# Patient Record
Sex: Female | Born: 1970
Health system: Southern US, Community
[De-identification: ages and names within clinical notes are randomized; demographics above are authoritative.]

## PROBLEM LIST (undated history)

## (undated) ENCOUNTER — Non-Acute Institutional Stay: Payer: PRIVATE HEALTH INSURANCE

## (undated) DIAGNOSIS — E7800 Pure hypercholesterolemia, unspecified: Secondary | ICD-10-CM

## (undated) DIAGNOSIS — E079 Disorder of thyroid, unspecified: Secondary | ICD-10-CM

## (undated) DIAGNOSIS — I10 Essential (primary) hypertension: Secondary | ICD-10-CM

## (undated) DIAGNOSIS — J302 Other seasonal allergic rhinitis: Secondary | ICD-10-CM

## (undated) HISTORY — DX: Essential (primary) hypertension: I10

## (undated) HISTORY — PX: BREAST SURGERY: SHX581

## (undated) HISTORY — DX: Other seasonal allergic rhinitis: J30.2

## (undated) HISTORY — PX: GYNECOLOGIC CRYOSURGERY: SHX857

## (undated) HISTORY — DX: Disorder of thyroid, unspecified: E07.9

## (undated) HISTORY — DX: Pure hypercholesterolemia, unspecified: E78.00

---

## 1997-10-30 ENCOUNTER — Inpatient Hospital Stay (HOSPITAL_COMMUNITY): Admission: AD | Admit: 1997-10-30 | Discharge: 1997-10-31 | Payer: Self-pay | Admitting: Gynecology

## 1998-12-31 ENCOUNTER — Other Ambulatory Visit: Admission: RE | Admit: 1998-12-31 | Discharge: 1998-12-31 | Payer: Self-pay | Admitting: Gynecology

## 2001-05-18 ENCOUNTER — Other Ambulatory Visit: Admission: RE | Admit: 2001-05-18 | Discharge: 2001-05-18 | Payer: Self-pay | Admitting: Gynecology

## 2002-11-01 ENCOUNTER — Other Ambulatory Visit: Admission: RE | Admit: 2002-11-01 | Discharge: 2002-11-01 | Payer: Self-pay | Admitting: Gynecology

## 2004-04-03 ENCOUNTER — Other Ambulatory Visit: Admission: RE | Admit: 2004-04-03 | Discharge: 2004-04-03 | Payer: Self-pay | Admitting: Gynecology

## 2004-10-01 ENCOUNTER — Other Ambulatory Visit: Admission: RE | Admit: 2004-10-01 | Discharge: 2004-10-01 | Payer: Self-pay | Admitting: Gynecology

## 2005-04-10 ENCOUNTER — Other Ambulatory Visit: Admission: RE | Admit: 2005-04-10 | Discharge: 2005-04-10 | Payer: Self-pay | Admitting: Gynecology

## 2006-10-07 ENCOUNTER — Other Ambulatory Visit: Admission: RE | Admit: 2006-10-07 | Discharge: 2006-10-07 | Payer: Self-pay | Admitting: Gynecology

## 2006-10-11 ENCOUNTER — Encounter: Admission: RE | Admit: 2006-10-11 | Discharge: 2006-10-11 | Payer: Self-pay | Admitting: Gynecology

## 2007-10-13 ENCOUNTER — Other Ambulatory Visit: Admission: RE | Admit: 2007-10-13 | Discharge: 2007-10-13 | Payer: Self-pay | Admitting: Gynecology

## 2008-12-27 ENCOUNTER — Ambulatory Visit: Payer: Self-pay | Admitting: Women's Health

## 2008-12-27 ENCOUNTER — Other Ambulatory Visit: Admission: RE | Admit: 2008-12-27 | Discharge: 2008-12-27 | Payer: Self-pay | Admitting: Gynecology

## 2008-12-27 ENCOUNTER — Encounter: Payer: Self-pay | Admitting: Women's Health

## 2009-12-02 ENCOUNTER — Other Ambulatory Visit: Admission: RE | Admit: 2009-12-02 | Discharge: 2009-12-02 | Payer: Self-pay | Admitting: Gynecology

## 2009-12-02 ENCOUNTER — Ambulatory Visit: Payer: Self-pay | Admitting: Women's Health

## 2010-12-16 HISTORY — PX: REFRACTIVE SURGERY: SHX103

## 2011-09-16 ENCOUNTER — Ambulatory Visit (INDEPENDENT_AMBULATORY_CARE_PROVIDER_SITE_OTHER): Payer: BC Managed Care – PPO | Admitting: Women's Health

## 2011-09-16 ENCOUNTER — Encounter: Payer: Self-pay | Admitting: Women's Health

## 2011-09-16 ENCOUNTER — Other Ambulatory Visit: Payer: Self-pay | Admitting: Women's Health

## 2011-09-16 VITALS — BP 142/100 | Ht 65.0 in | Wt 156.0 lb

## 2011-09-16 DIAGNOSIS — N920 Excessive and frequent menstruation with regular cycle: Secondary | ICD-10-CM

## 2011-09-16 DIAGNOSIS — Z01419 Encounter for gynecological examination (general) (routine) without abnormal findings: Secondary | ICD-10-CM

## 2011-09-16 DIAGNOSIS — Z833 Family history of diabetes mellitus: Secondary | ICD-10-CM

## 2011-09-16 LAB — CBC WITH DIFFERENTIAL/PLATELET
Lymphocytes Relative: 35 % (ref 12–46)
MCV: 93.2 fL (ref 78.0–100.0)
Monocytes Absolute: 0.5 10*3/uL (ref 0.1–1.0)
Neutro Abs: 4.3 10*3/uL (ref 1.7–7.7)
Neutrophils Relative %: 57 % (ref 43–77)
Platelets: 278 10*3/uL (ref 150–400)
WBC: 7.6 10*3/uL (ref 4.0–10.5)

## 2011-09-16 LAB — URINALYSIS, ROUTINE W REFLEX MICROSCOPIC
Bilirubin Urine: NEGATIVE
Hgb urine dipstick: NEGATIVE
Leukocytes, UA: NEGATIVE
Protein, ur: NEGATIVE mg/dL
Specific Gravity, Urine: 1.02 (ref 1.005–1.030)

## 2011-09-16 NOTE — Patient Instructions (Signed)
Call office first day of next cycle to schedule [facility]  With Dr Audie Box

## 2011-09-16 NOTE — Progress Notes (Signed)
Bethany Mata 1971/03/27 [id number]    History:    The patient presents for annual exam.  Cycles have been every month for 5 days and in the last 6-9 months states periods are less than 21 days, several cycles extremely heavy. Denies any bleeding between cycles. History of normal Paps since 89, cryo-in 1990. Husband had vasectomy.   Past medical history, past surgical history, family history and social history were all reviewed and documented in the EPIC chart. History of elevated blood pressures in pregnancy. 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   ROS:  A  ROS was performed and pertinent positives and negatives are included in the history.  Exam:  Filed Vitals:   09/16/11 1444  BP: 142/100    General appearance:  Normal Head/Neck:  Normal, without cervical or supraclavicular adenopathy. Thyroid:  Symmetrical, normal in size, without palpable masses or nodularity. Respiratory  Effort:  Normal  Auscultation:  Clear without wheezing or rhonchi Cardiovascular  Auscultation:  Regular rate, without rubs, murmurs or gallops  Edema/varicosities:  Not grossly evident Abdominal  Soft,nontender, without masses, guarding or rebound.  Liver/spleen:  No organomegaly noted  Hernia:  None appreciated  Skin  Inspection:  Grossly normal  Palpation:  Grossly normal Neurologic/psychiatric  Orientation:  Normal with appropriate conversation.  Mood/affect:  Normal  Genitourinary    Breasts: Examined lying and sitting.     Right: Without masses, retractions, discharge or axillary adenopathy.     Left: Without masses, retractions, discharge or axillary adenopathy.   Inguinal/mons:  Normal without inguinal adenopathy  External genitalia:  Normal  BUS/Urethra/Skene's glands:  Normal  Bladder:  Normal  Vagina:  Normal  Cervix:  Normal  Uterus:   normal in size, shape and contour.  Midline and mobile  Adnexa/parametria:      Rt: Without masses or tenderness.   Lt: Without masses or tenderness.  Anus and perineum: Normal  Digital rectal exam: Normal sphincter tone without palpated masses or tenderness  Assessment/Plan:  41 y.o. M. WF G2 P2 for annual exam.    Menorrhagia with increased irregularity in last year BP 142/100-took Sudafed today.  Plan: Options for menorrhagia reviewed. Will schedule sonohysterogram with Dr. Audie Box, HER option discussed, handout given. SBE's, exercise, annual mammogram, calcium rich diet encouraged. Reviewed importance of followup for blood pressure, reviewed if greater than 130/80 to followup with primary care for possible medication. Instructed to avoid Sudafed. CBC, Glucose, TSH, prolactin, UA.    Harrington Challenger WHNP, 5:06 PM 09/16/2011

## 2011-09-17 LAB — THYROID PROFILE - CHCC
Free Thyroxine Index: 2.2 (ref 1.0–3.9)
T4, Total: 9.1 ug/dL (ref 5.0–12.5)

## 2011-10-05 ENCOUNTER — Other Ambulatory Visit: Payer: Self-pay | Admitting: *Deleted

## 2011-10-05 DIAGNOSIS — E039 Hypothyroidism, unspecified: Secondary | ICD-10-CM

## 2011-10-05 MED ORDER — SYNTHROID 50 MCG PO TABS: 50.0000 ug | ORAL_TABLET | Freq: Every day | ORAL | Status: DC

## 2011-10-07 ENCOUNTER — Ambulatory Visit (INDEPENDENT_AMBULATORY_CARE_PROVIDER_SITE_OTHER): Payer: BC Managed Care – PPO

## 2011-10-07 ENCOUNTER — Other Ambulatory Visit: Payer: BC Managed Care – PPO

## 2011-10-07 ENCOUNTER — Other Ambulatory Visit: Payer: Self-pay | Admitting: Gynecology

## 2011-10-07 ENCOUNTER — Ambulatory Visit (INDEPENDENT_AMBULATORY_CARE_PROVIDER_SITE_OTHER): Payer: BC Managed Care – PPO | Admitting: Gynecology

## 2011-10-07 ENCOUNTER — Ambulatory Visit: Payer: BC Managed Care – PPO | Admitting: Gynecology

## 2011-10-07 ENCOUNTER — Encounter: Payer: Self-pay | Admitting: Gynecology

## 2011-10-07 DIAGNOSIS — N854 Malposition of uterus: Secondary | ICD-10-CM

## 2011-10-07 DIAGNOSIS — E039 Hypothyroidism, unspecified: Secondary | ICD-10-CM

## 2011-10-07 DIAGNOSIS — D259 Leiomyoma of uterus, unspecified: Secondary | ICD-10-CM

## 2011-10-07 DIAGNOSIS — D251 Intramural leiomyoma of uterus: Secondary | ICD-10-CM

## 2011-10-07 DIAGNOSIS — N8300 Follicular cyst of ovary, unspecified side: Secondary | ICD-10-CM

## 2011-10-07 DIAGNOSIS — N920 Excessive and frequent menstruation with regular cycle: Secondary | ICD-10-CM

## 2011-10-07 NOTE — Patient Instructions (Signed)
Office will contact you to schedule the ablation. We will call you with your thyroid results

## 2011-10-07 NOTE — Progress Notes (Signed)
Patient presents for sonohysterogram due to history of progressively worsening periods that are now every 21 days, bleeding heavily, double protection, bleedthrough episodes. She had a thyroid panel that showed a mildly elevated TSH but normal T4 and she Willrepeat her TSH today.   Sonohysterogram shows endometrial echo 10.6 mm, retroverted uterus with 2 small myomas at 24 and 13 mm. Initially there was an echogenic area within the endometrium at 7 x 9 mm. Her right/left ovaries are normal with physiologic changes. Sonohysterogram was performed, sterile technique, easy catheter introduction, good distention, no abnormality seen. The echogenic areas was within the endometrium. An endometrial biopsy was taken. The patient tolerated well.  Assessment and plan: Menorrhagia, mildly elevated TSH. She'll repeat that today. With the normal T4 I doubt this is continuing to her menorrhagia I think this is more hormone related. Using vasectomy birth control. I reviewed options with the patient to include hormonal manipulation, Mirena IUD, endometrial ablation, hysterectomy. The patient already has thought of her options wants to proceed with HerOption endometrial ablation. I reviewed what was involved with the procedure, risks/benefits. She understands it is permanent, that she should never pursue pregnancy following this and should other situations present themselves she needs to use assured birth control. She also understands that she will probably continue to have menses afterwards but hopefully later and that amenorrhea is not guaranteed. Lastly she understands that there are failures and that her bleeding may persistently be heavier or worsen and that we will have to use an alternative treatment in the future. We'll go ahead and schedule her and she will represent for a preoperative appointment. She will follow up for biopsy results and TSH results. Possible need for thyroid replacement was reviewed with her and the  risks/benefits of this discussed.

## 2011-10-08 ENCOUNTER — Telehealth: Payer: Self-pay

## 2011-10-08 ENCOUNTER — Other Ambulatory Visit: Payer: Self-pay | Admitting: Women's Health

## 2011-10-08 DIAGNOSIS — E039 Hypothyroidism, unspecified: Secondary | ICD-10-CM

## 2011-10-08 LAB — TSH: TSH: 5.957 u[IU]/mL — ABNORMAL HIGH (ref 0.350–4.500)

## 2011-10-08 NOTE — Telephone Encounter (Signed)
Dr. Velvet Bathe sent me order to schedule Bethany Mata Option Ablation for patient. I called Bethany Mata to let Bethany Mata know I checked ins benefits and it is a covered procedure based on medical necessity and applies to Bethany Mata ded and co-insurance.  Bethany Mata prepayment amount is $1318.00 and she was informed. She has HSA card she plans to use to pay for it.  She said Bethany Mata cycles are so irregular she cannot predict when Bethany Mata next period will be.  She will call me Day One of Bethany Mata menses. I explained importance of calling me asap after starts menses so I can call Prometrium in for Bethany Mata to start Day 3.  If weekend start she will call me first thing on Monday morning.  I will schedule Bethany Mata then and e-scribe Bethany Mata Prometrium.

## 2011-10-16 HISTORY — PX: ENDOMETRIAL ABLATION: SHX621

## 2011-10-23 ENCOUNTER — Other Ambulatory Visit: Payer: Self-pay

## 2011-10-23 ENCOUNTER — Telehealth: Payer: Self-pay

## 2011-10-23 DIAGNOSIS — N920 Excessive and frequent menstruation with regular cycle: Secondary | ICD-10-CM

## 2011-10-23 MED ORDER — MISOPROSTOL 200 MCG PO TABS: ORAL_TABLET | ORAL | Status: AC

## 2011-10-23 MED ORDER — PROGESTERONE MICRONIZED 200 MG PO CAPS: 200.0000 mg | ORAL_CAPSULE | Freq: Every day | ORAL | Status: DC

## 2011-10-23 NOTE — Telephone Encounter (Signed)
Pt. Called to tell me her menses began yesterday and she wants to schedule Her Option ablation.  I escribed her Prometrium and instructed her to start it tomorrow night hs.  Also, e-scribed her Cytotec and instructed her regarding that.  She will see Dr. Velvet Bathe for preop consult on 3/22 and procedure will be Day 20 of her cycle on March 27th 9:30am/10:00am.  She was instructed that she will need full bladder am of procedure and also someone to drive her to and from procedure.  I have mailed her all the dates as well as full bladder instructions.  She will call me if any questions.

## 2011-11-06 ENCOUNTER — Encounter: Payer: Self-pay | Admitting: Gynecology

## 2011-11-06 ENCOUNTER — Ambulatory Visit (INDEPENDENT_AMBULATORY_CARE_PROVIDER_SITE_OTHER): Payer: BC Managed Care – PPO | Admitting: Gynecology

## 2011-11-06 DIAGNOSIS — E039 Hypothyroidism, unspecified: Secondary | ICD-10-CM

## 2011-11-06 DIAGNOSIS — N920 Excessive and frequent menstruation with regular cycle: Secondary | ICD-10-CM

## 2011-11-06 MED ORDER — AZITHROMYCIN 500 MG PO TABS: 500.0000 mg | ORAL_TABLET | Freq: Every day | ORAL | Status: AC

## 2011-11-06 MED ORDER — HYDROCODONE-ACETAMINOPHEN 5-500 MG PO TABS: 1.0000 | ORAL_TABLET | Freq: Four times a day (QID) | ORAL | Status: AC | PRN

## 2011-11-06 MED ORDER — DIAZEPAM 5 MG PO TABS: 5.0000 mg | ORAL_TABLET | Freq: Four times a day (QID) | ORAL | Status: AC | PRN

## 2011-11-06 MED ORDER — SYNTHROID 50 MCG PO TABS: 50.0000 ug | ORAL_TABLET | Freq: Every day | ORAL | Status: DC

## 2011-11-06 NOTE — Patient Instructions (Signed)
Call if you have any questions. Follow up for your HerOption endometrial ablation

## 2011-11-06 NOTE — Progress Notes (Signed)
Patient presents preop for her upcoming HerOption endometrial ablation. Recently was started on Synthroid 50 mcg 3 weeks ago due to her mildly elevated TSH. I reviewed the procedure with her to include the expected intraoperative postoperative courses. The acute risks of bleeding, infection, uterine perforation requiring major exploratory reparative surgeries was reviewed. She understands there are no guarantees as far as menorrhagia relief and that her periods may continue heavier return heavy requiring future treatments up to including hysterectomy. She also understands that she more than likely will continue to have monthly menses but hopefully that they will be lighter.  She understands she should never get pregnant following this procedure. She is using vasectomy but understands that if other opportunities present that she needs to use assured birth control.  I also reviewed the long-term risks to include hematometria. She is ready for our consent form and signed the consent form. I reviewed her medication list with her and how to take the medications and answered all of her questions.  Exam with Sherrilyn Rist chaperone present Pelvic external BUS vagina normal. Cervix normal. Uterus normal size midline mobile nontender. Adnexa without masses or tenderness  Assessment and plan: Menorrhagia unacceptable to the patient, negative sonohysterogram and biopsy, mildly elevated TSH currently on Synthroid replacement for HerOption endometrial ablation. Her questions were answered she is ready to proceed with the procedure.  Patient knows to repeat her TSH when she comes in for her postop visit after the ablation.

## 2011-11-11 ENCOUNTER — Ambulatory Visit (INDEPENDENT_AMBULATORY_CARE_PROVIDER_SITE_OTHER): Payer: BC Managed Care – PPO

## 2011-11-11 ENCOUNTER — Encounter: Payer: Self-pay | Admitting: Gynecology

## 2011-11-11 ENCOUNTER — Ambulatory Visit (INDEPENDENT_AMBULATORY_CARE_PROVIDER_SITE_OTHER): Payer: BC Managed Care – PPO | Admitting: Gynecology

## 2011-11-11 VITALS — BP 140/86 | HR 84

## 2011-11-11 VITALS — BP 140/100 | HR 80

## 2011-11-11 DIAGNOSIS — N949 Unspecified condition associated with female genital organs and menstrual cycle: Secondary | ICD-10-CM

## 2011-11-11 DIAGNOSIS — N920 Excessive and frequent menstruation with regular cycle: Secondary | ICD-10-CM

## 2011-11-11 MED ORDER — KETOROLAC TROMETHAMINE 30 MG/ML IJ SOLN
60.0000 mg | Freq: Once | INTRAMUSCULAR | Status: AC
  Administered 2011-11-11: 60 mg via INTRAMUSCULAR

## 2011-11-11 MED ORDER — LIDOCAINE HCL 1 % IJ SOLN: 10.0000 mL | Freq: Once | INTRAMUSCULAR | Status: DC

## 2011-11-11 NOTE — Progress Notes (Signed)
HER OPTION ENDOMETRIAL ABLATION PROCEDURAL NOTE    TORINA EY 1970/10/23 [id number]   11/11/2011  Diagnosis:  Excessive uterine bleeding / Menorrhagia  Procedure:  Endometrial cryoablation with intraoperative ultrasonic guidance  Procedure:  The patient was brought to the treatment room having previously been counseled for the procedure and having signed the consent form that is scanned into Epic. Pre procedural medications received were Toradol 30 mg IM.  The patient was placed in the dorsal lithotomy position and a speculum was inserted. The cervix and upper vagina were cleansed with Betadine. A single-tooth tenaculum was placed on the anterior lip of the cervix. A  paracervical block was placed yes using 10 cc's of 1% lidocaine.  The uterus was yes sounded 9 centimeters. Cervical dilatation performed no . Under ultrasound guidance, the Her Option probe was introduced into the uterine cavity after the preprocedural sequence was performed. After assuring proper cornual placement, cryoablation was then performed under continuous ultrasound guidance monitoring the growth of the cryo-zone. Sequential cryoablation's were performed in the following order:   Location   Length of Time Myometrial Depth 1. Right cornua   6 minutes  Sagittal 11.5 mm Transverse 10.4 mm 2. Left cornua   5 minutes  Sagittal 14.4 mm Transverse 14.4 mm  Upon completion of the procedure the instruments were removed, hemostasis visualized the patient was assisted to the bathroom and then to another exam room where she was observed. The patient tolerated the procedure well and was released in stable condition with her driver along with a copy of the postprocedural instructions and precautions which were reviewed with her. She is to return to the office 10 April for post procedural check and received an appointment date and time.    Dara Lords MD, 11:13 AM 11/11/2011

## 2011-11-11 NOTE — Patient Instructions (Signed)
Endometrial Ablation Endometrial ablation removes the lining of the uterus (endometrium). It is usually a same day, outpatient treatment. Ablation helps avoid major surgery (such as a hysterectomy). A hysterectomy is removal of the cervix and uterus. Endometrial ablation has less risk and complications, has a shorter recovery period and is less expensive. After endometrial ablation, most women will have little or no menstrual bleeding. You may not keep your fertility. Pregnancy is no longer likely after this procedure but if you are pre-menopausal, you still need to use a reliable method of birth control following the procedure because pregnancy can occur. REASONS TO HAVE THE PROCEDURE MAY INCLUDE:  Heavy periods.   Bleeding that is causing anemia.   Anovulatory bleeding, very irregular, bleeding.   Bleeding submucous fibroids (on the lining inside the uterus) if they are smaller than 3 centimeters.  REASONS NOT TO HAVE THE PROCEDURE MAY INCLUDE:  You wish to have more children.   You have a pre-cancerous or cancerous problem. The cause of any abnormal bleeding must be diagnosed before having the procedure.   You have pain coming from the uterus.   You have a submucus fibroid larger than 3 centimeters.   You recently had a baby.   You recently had an infection in the uterus.   You have a severe retro-flexed, tipped uterus and cannot insert the instrument to do the ablation.   You had a Cesarean section or deep major surgery on the uterus.   The inner cavity of the uterus is too large for the endometrial ablation instrument.  RISKS AND COMPLICATIONS   Perforation of the uterus.   Bleeding.   Infection of the uterus, bladder or vagina.   Injury to surrounding organs.   Cutting the cervix.   An air bubble to the lung (air embolus).   Pregnancy following the procedure.   Failure of the procedure to help the problem requiring hysterectomy.   Decreased ability to diagnose  cancer in the lining of the uterus.  BEFORE THE PROCEDURE  The lining of the uterus must be tested to make sure there is no pre-cancerous or cancer cells present.   Medications may be given to make the lining of the uterus thinner.   Ultrasound may be used to evaluate the size and look for abnormalities of the uterus.   Future pregnancy is not desired.  PROCEDURE  There are different ways to destroy the lining of the uterus.   Resectoscope - radio frequency-alternating electric current is the most common one used.   Cryotherapy - freezing the lining of the uterus.   Heated Free Liquid - heated salt (saline) solution inserted into the uterus.   Microwave - uses high energy microwaves in the uterus.   Thermal Balloon - a catheter with a balloon tip is inserted into the uterus and filled with heated fluid.  Your caregiver will talk with you about the method used in this clinic. They will also instruct you on the pros and cons of the procedure. Endometrial ablation is performed along with a procedure called operative hysteroscopy. A narrow viewing tube is inserted through the birth canal (vagina) and through the cervix into the uterus. A tiny camera attached to the viewing tube (hysteroscope) allows the uterine cavity to be shown on a TV monitor during surgery. Your uterus is filled with a harmless liquid to make the procedure easier. The lining of the uterus is then removed. The lining can also be removed with a resectoscope which allows your surgeon   to cut away the lining of the uterus under direct vision. Usually, you will be able to go home within an hour after the procedure. HOME CARE INSTRUCTIONS   Do not drive for 24 hours.   No tampons, douching or intercourse for 2 weeks or until your caregiver approves.   Rest at home for 24 to 48 hours. You may then resume normal activities unless told differently by your caregiver.   Take your temperature two times a day for 4 days, and record  it.   Take any medications your caregiver has ordered, as directed.   Use some form of contraception if you are pre-menopausal and do not want to get pregnant.  Bleeding after the procedure is normal. It varies from light spotting and mildly watery to bloody discharge for 4 to 6 weeks. You may also have mild cramping. Only take over-the-counter or prescription medicines for pain, discomfort, or fever as directed by your caregiver. Do not use aspirin, as this may aggravate bleeding. Frequent urination during the first 24 hours is normal. You will not know how effective your surgery is until at least 3 months after the surgery. SEEK IMMEDIATE MEDICAL CARE IF:   Bleeding is heavier than a normal menstrual cycle.   An oral temperature above 102 F (38.9 C) develops.   You have increasing cramps or pains not relieved with medication or develop belly (abdominal) pain which does not seem to be related to the same area of earlier cramping and pain.   You are light headed, weak or have fainting episodes.   You develop pain in the shoulder strap areas.   You have chest or leg pain.   You have abnormal vaginal discharge.   You have painful urination.  Document Released: 06/12/2004 Document Revised: 07/23/2011 Document Reviewed: 09/10/2007 Exit[employer] Patient Information 2012 [employer]. 

## 2011-11-19 ENCOUNTER — Other Ambulatory Visit: Payer: Self-pay | Admitting: Gynecology

## 2011-11-19 DIAGNOSIS — Z1231 Encounter for screening mammogram for malignant neoplasm of breast: Secondary | ICD-10-CM

## 2011-11-25 ENCOUNTER — Ambulatory Visit (INDEPENDENT_AMBULATORY_CARE_PROVIDER_SITE_OTHER): Payer: BC Managed Care – PPO | Admitting: Gynecology

## 2011-11-25 ENCOUNTER — Encounter: Payer: Self-pay | Admitting: Gynecology

## 2011-11-25 DIAGNOSIS — E039 Hypothyroidism, unspecified: Secondary | ICD-10-CM

## 2011-11-25 DIAGNOSIS — Z9889 Other specified postprocedural states: Secondary | ICD-10-CM

## 2011-11-25 NOTE — Progress Notes (Signed)
Postoperative check status post HerOption endometrial ablation. Doing well with nuisance discharge.  Was started on Synthroid about a month ago and also needs to have a follow up TSH.  Exam was Sherrilyn Rist chaperone present Pelvic external BUS vagina normal. Cervix normal. Uterus normal size midline mobile nontender. Adnexa without masses or tenderness.  Assessment and plan: Postop check status post HerOption endometrial ablation. Keep menstrual calendar. As long as she is doing well follow up in January when she is due for her annual. Otherwise she'll follow up sooner. We'll check TSH today and adjust Synthroid dose accordingly.

## 2011-11-25 NOTE — Patient Instructions (Signed)
Follow up for TSH level. Keep menstrual calendar as long as acceptable periods then follow up in January 2014 when you're due for your annual exam

## 2011-11-26 ENCOUNTER — Telehealth: Payer: Self-pay | Admitting: Gynecology

## 2011-11-26 DIAGNOSIS — E039 Hypothyroidism, unspecified: Secondary | ICD-10-CM

## 2011-11-26 LAB — TSH: TSH: 5.252 u[IU]/mL — ABNORMAL HIGH (ref 0.350–4.500)

## 2011-11-26 MED ORDER — LEVOTHYROXINE SODIUM 75 MCG PO TABS: 75.0000 ug | ORAL_TABLET | Freq: Every day | ORAL | Status: DC

## 2011-11-26 NOTE — Telephone Encounter (Signed)
Tell patient that her TSH is still mildly elevated I want to increase her Synthroid to 75 mcg will do this for 2 months and then recheck her TSH. I put the orders in for both

## 2011-11-26 NOTE — Telephone Encounter (Signed)
PT INFORMED WITH THE BELOW NOTE, RECALL LETTER IN MAIL TO REMIND PT FOR RECHECK.

## 2011-11-26 NOTE — Telephone Encounter (Signed)
Left message to call.

## 2011-12-16 ENCOUNTER — Ambulatory Visit (HOSPITAL_COMMUNITY)
Admission: RE | Admit: 2011-12-16 | Discharge: 2011-12-16 | Disposition: A | Discharge status: Home / Self Care | Payer: BC Managed Care – PPO | Source: Ambulatory Visit | Attending: Gynecology | Admitting: Gynecology

## 2011-12-16 DIAGNOSIS — Z1231 Encounter for screening mammogram for malignant neoplasm of breast: Secondary | ICD-10-CM | POA: Insufficient documentation

## 2012-03-14 ENCOUNTER — Other Ambulatory Visit: Payer: BC Managed Care – PPO

## 2012-03-14 DIAGNOSIS — E039 Hypothyroidism, unspecified: Secondary | ICD-10-CM

## 2012-03-15 ENCOUNTER — Other Ambulatory Visit: Payer: Self-pay | Admitting: Gynecology

## 2012-03-15 MED ORDER — LEVOTHYROXINE SODIUM 75 MCG PO TABS: 75.0000 ug | ORAL_TABLET | Freq: Every day | ORAL | Status: DC

## 2012-06-30 ENCOUNTER — Other Ambulatory Visit: Payer: Self-pay | Admitting: Women's Health

## 2012-06-30 ENCOUNTER — Telehealth: Payer: Self-pay | Admitting: Women's Health

## 2012-06-30 DIAGNOSIS — E039 Hypothyroidism, unspecified: Secondary | ICD-10-CM

## 2012-06-30 MED ORDER — LEVOTHYROXINE SODIUM 75 MCG PO TABS: 75.0000 ug | ORAL_TABLET | Freq: Every day | ORAL | Status: DC

## 2012-06-30 NOTE — Telephone Encounter (Signed)
Patient said she has no refills on her thyroid medication and was not sure if she needed to recheck labs now or if we could call it in for her.

## 2012-06-30 NOTE — Telephone Encounter (Signed)
Left message that Synthroid would be called in, instructed to schedule lab appointment to recheck TSH. Instructed to call if questions with request.

## 2012-08-19 ENCOUNTER — Other Ambulatory Visit: Payer: BC Managed Care – PPO

## 2012-08-19 DIAGNOSIS — E039 Hypothyroidism, unspecified: Secondary | ICD-10-CM

## 2012-08-19 LAB — TSH: TSH: 4.719 u[IU]/mL — ABNORMAL HIGH (ref 0.350–4.500)

## 2012-08-23 ENCOUNTER — Telehealth: Payer: Self-pay | Admitting: Gynecology

## 2012-08-23 DIAGNOSIS — E039 Hypothyroidism, unspecified: Secondary | ICD-10-CM

## 2012-08-23 MED ORDER — LEVOTHYROXINE SODIUM 88 MCG PO TABS: 88.0000 ug | ORAL_TABLET | Freq: Every day | ORAL | Status: DC

## 2012-08-23 NOTE — Telephone Encounter (Signed)
Recommend synthroid 88 micrograms for 2 months then recheck TSH then

## 2012-08-23 NOTE — Telephone Encounter (Signed)
Pt informed, TSH level order placed.

## 2012-10-26 ENCOUNTER — Other Ambulatory Visit: Payer: Self-pay | Admitting: Gynecology

## 2012-10-26 ENCOUNTER — Ambulatory Visit (INDEPENDENT_AMBULATORY_CARE_PROVIDER_SITE_OTHER): Payer: BC Managed Care – PPO | Admitting: Women's Health

## 2012-10-26 ENCOUNTER — Encounter: Payer: Self-pay | Admitting: Women's Health

## 2012-10-26 VITALS — BP 134/90 | Ht 65.25 in | Wt 171.0 lb

## 2012-10-26 DIAGNOSIS — Z1231 Encounter for screening mammogram for malignant neoplasm of breast: Secondary | ICD-10-CM

## 2012-10-26 DIAGNOSIS — Z01419 Encounter for gynecological examination (general) (routine) without abnormal findings: Secondary | ICD-10-CM

## 2012-10-26 DIAGNOSIS — Z13220 Encounter for screening for lipoid disorders: Secondary | ICD-10-CM

## 2012-10-26 DIAGNOSIS — Z833 Family history of diabetes mellitus: Secondary | ICD-10-CM

## 2012-10-26 DIAGNOSIS — E039 Hypothyroidism, unspecified: Secondary | ICD-10-CM

## 2012-10-26 DIAGNOSIS — I10 Essential (primary) hypertension: Secondary | ICD-10-CM | POA: Insufficient documentation

## 2012-10-26 LAB — CBC WITH DIFFERENTIAL/PLATELET
Eosinophils Relative: 2 % (ref 0–5)
HCT: 43 % (ref 36.0–46.0)
Lymphocytes Relative: 34 % (ref 12–46)
Lymphs Abs: 3.6 10*3/uL (ref 0.7–4.0)
MCV: 90.1 fL (ref 78.0–100.0)
Platelets: 273 10*3/uL (ref 150–400)
RBC: 4.77 MIL/uL (ref 3.87–5.11)
WBC: 10.6 10*3/uL — ABNORMAL HIGH (ref 4.0–10.5)

## 2012-10-26 LAB — LIPID PANEL
HDL: 50 mg/dL (ref >39)
LDL Cholesterol: 159 mg/dL — ABNORMAL HIGH (ref 0–99)
Triglycerides: 219 mg/dL — ABNORMAL HIGH (ref <150)

## 2012-10-26 LAB — GLUCOSE, RANDOM: Glucose, Bld: 92 mg/dL (ref 70–99)

## 2012-10-26 NOTE — Progress Notes (Signed)
EMALEA MIX September 30, 1970 [id number]    History:    The patient presents for annual exam.  Most months 3 day light cycle, occasionally irregular bleeding throughout the month/vasectomy. Her option 2013 with good relief of menorrhagia. History of cryo- 1990 with normal Paps after 95. Normal mammograms. Hypothyroid, hypertension diagnosis past year medications  still being adjusted.   Past medical history, past surgical history, family history and social history were all reviewed and documented in the EPIC chart. Taylor 17, Logan 14, stepdaughter [name] 9, full custody having some ADHD issues. 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   ROS:  A  ROS was performed and pertinent positives and negatives are included in the history.  Exam:  Filed Vitals:   10/26/12 1603  BP: 134/90    General appearance:  Normal Head/Neck:  Normal, without cervical or supraclavicular adenopathy. Thyroid:  Symmetrical, normal in size, without palpable masses or nodularity. Respiratory  Effort:  Normal  Auscultation:  Clear without wheezing or rhonchi Cardiovascular  Auscultation:  Regular rate, without rubs, murmurs or gallops  Edema/varicosities:  Not grossly evident Abdominal  Soft,nontender, without masses, guarding or rebound.  Liver/spleen:  No organomegaly noted  Hernia:  None appreciated  Skin  Inspection:  Grossly normal  Palpation:  Grossly normal Neurologic/psychiatric  Orientation:  Normal with appropriate conversation.  Mood/affect:  Normal  Genitourinary    Breasts: Examined lying and sitting.     Right: Without masses, retractions, discharge or axillary adenopathy.     Left: Without masses, retractions, discharge or axillary adenopathy.   Inguinal/mons:  Normal without inguinal adenopathy  External genitalia:  Normal  BUS/Urethra/Skene's glands:  Normal  Bladder:  Normal  Vagina:  Normal  Cervix:  Normal  Uterus:   normal in size, shape and contour.  Midline and  mobile  Adnexa/parametria:     Rt: Without masses or tenderness.   Lt: Without masses or tenderness.  Anus and perineum: Normal  Digital rectal exam: Normal sphincter tone without palpated masses or tenderness  Assessment/Plan:  42 y.o. M. WF G2 P2 for annual exam.     Her option 10/2011 good relief of menorrhagia Hypothyroid Hypertension-primary care meds  Plan: Requested labs. CBC, TSH, lipid panel, glucose, UA, Pap, placed normal Pap 2011, reviewed new screening guidelines. Will take labs to primary care as needed. History of elevated cholesterol in the past. SBE's, continue annual mammogram, calcium rich diet, vitamin D 1000 daily encouraged. Instructed to call or return if irregular bleeding becomes persistent/continues.    Harrington Challenger [facility], 4:53 PM 10/26/2012

## 2012-10-26 NOTE — Patient Instructions (Addendum)

## 2012-10-27 ENCOUNTER — Other Ambulatory Visit (HOSPITAL_COMMUNITY)
Admission: RE | Admit: 2012-10-27 | Discharge: 2012-10-27 | Disposition: A | Discharge status: Home / Self Care | Payer: BC Managed Care – PPO | Source: Ambulatory Visit | Attending: Women's Health | Admitting: Women's Health

## 2012-10-27 DIAGNOSIS — Z01419 Encounter for gynecological examination (general) (routine) without abnormal findings: Secondary | ICD-10-CM | POA: Insufficient documentation

## 2012-10-27 DIAGNOSIS — Z1151 Encounter for screening for human papillomavirus (HPV): Secondary | ICD-10-CM | POA: Insufficient documentation

## 2012-10-27 LAB — URINALYSIS W MICROSCOPIC + REFLEX CULTURE
Bacteria, UA: NONE SEEN
Bilirubin Urine: NEGATIVE
Casts: NONE SEEN
Glucose, UA: NEGATIVE mg/dL
Hgb urine dipstick: NEGATIVE
Ketones, ur: NEGATIVE mg/dL
pH: 6.5 (ref 5.0–8.0)

## 2012-10-27 LAB — URINE CULTURE: Colony Count: NO GROWTH

## 2012-10-27 NOTE — Addendum Note (Signed)
Addended by: Richardson Chiquito on: 10/27/2012 09:15 AM   Modules accepted: Orders

## 2012-10-28 ENCOUNTER — Telehealth: Payer: Self-pay | Admitting: *Deleted

## 2012-10-28 MED ORDER — LEVOTHYROXINE SODIUM 88 MCG PO TABS: 88.0000 ug | ORAL_TABLET | Freq: Every day | ORAL | Status: DC

## 2012-10-28 NOTE — Telephone Encounter (Signed)
Pt called requesting 90 day supply on synthroid 88 mcg. rx sent with refills until annual.

## 2012-12-21 ENCOUNTER — Ambulatory Visit (HOSPITAL_COMMUNITY): Payer: BC Managed Care – PPO

## 2013-03-10 ENCOUNTER — Other Ambulatory Visit: Payer: Self-pay | Admitting: Women's Health

## 2013-03-10 DIAGNOSIS — Z1231 Encounter for screening mammogram for malignant neoplasm of breast: Secondary | ICD-10-CM

## 2013-03-16 ENCOUNTER — Ambulatory Visit (HOSPITAL_COMMUNITY)
Admission: RE | Admit: 2013-03-16 | Discharge: 2013-03-16 | Disposition: A | Discharge status: Home / Self Care | Payer: BC Managed Care – PPO | Source: Ambulatory Visit | Attending: Women's Health | Admitting: Women's Health

## 2013-03-16 DIAGNOSIS — Z1231 Encounter for screening mammogram for malignant neoplasm of breast: Secondary | ICD-10-CM | POA: Insufficient documentation

## 2013-04-15 ENCOUNTER — Encounter (HOSPITAL_BASED_OUTPATIENT_CLINIC_OR_DEPARTMENT_OTHER): Payer: Self-pay | Admitting: *Deleted

## 2013-04-15 ENCOUNTER — Emergency Department (HOSPITAL_BASED_OUTPATIENT_CLINIC_OR_DEPARTMENT_OTHER)
Admission: EM | Admit: 2013-04-15 | Discharge: 2013-04-15 | Disposition: A | Discharge status: Home / Self Care | Payer: BC Managed Care – PPO | Attending: Emergency Medicine | Admitting: Emergency Medicine

## 2013-04-15 DIAGNOSIS — L03211 Cellulitis of face: Secondary | ICD-10-CM | POA: Insufficient documentation

## 2013-04-15 DIAGNOSIS — I10 Essential (primary) hypertension: Secondary | ICD-10-CM | POA: Insufficient documentation

## 2013-04-15 DIAGNOSIS — R220 Localized swelling, mass and lump, head: Secondary | ICD-10-CM | POA: Insufficient documentation

## 2013-04-15 DIAGNOSIS — Z79899 Other long term (current) drug therapy: Secondary | ICD-10-CM | POA: Insufficient documentation

## 2013-04-15 DIAGNOSIS — J309 Allergic rhinitis, unspecified: Secondary | ICD-10-CM | POA: Insufficient documentation

## 2013-04-15 DIAGNOSIS — E079 Disorder of thyroid, unspecified: Secondary | ICD-10-CM | POA: Insufficient documentation

## 2013-04-15 DIAGNOSIS — H5789 Other specified disorders of eye and adnexa: Secondary | ICD-10-CM | POA: Insufficient documentation

## 2013-04-15 DIAGNOSIS — L0201 Cutaneous abscess of face: Secondary | ICD-10-CM | POA: Insufficient documentation

## 2013-04-15 MED ORDER — FLUORESCEIN SODIUM 1 MG OP STRP
ORAL_STRIP | OPHTHALMIC | Status: AC
  Filled 2013-04-15: qty 1

## 2013-04-15 MED ORDER — CEPHALEXIN 500 MG PO CAPS: 500.0000 mg | ORAL_CAPSULE | Freq: Four times a day (QID) | ORAL | Status: DC

## 2013-04-15 MED ORDER — SULFAMETHOXAZOLE-TRIMETHOPRIM 800-160 MG PO TABS: 1.0000 | ORAL_TABLET | Freq: Two times a day (BID) | ORAL | Status: DC

## 2013-04-15 MED ORDER — TETRACAINE HCL 0.5 % OP SOLN
OPHTHALMIC | Status: AC
  Filled 2013-04-15: qty 2

## 2013-04-15 MED ORDER — LIDOCAINE HCL (PF) 1 % IJ SOLN
INTRAMUSCULAR | Status: AC
  Administered 2013-04-15: 5 mL
  Filled 2013-04-15: qty 5

## 2013-04-15 MED ORDER — CEFTRIAXONE SODIUM 1 G IJ SOLR
1.0000 g | Freq: Once | INTRAMUSCULAR | Status: AC
  Administered 2013-04-15: 1 g via INTRAMUSCULAR
  Filled 2013-04-15: qty 10

## 2013-04-15 NOTE — ED Provider Notes (Signed)
CSN: [id number]     Arrival date & time 04/15/13  1842 History   First MD Initiated Contact with Patient 04/15/13 2119     Chief Complaint  Patient presents with  . Eye Problem   (Consider location/radiation/quality/duration/timing/severity/associated sxs/prior Treatment) Patient is a 42 y.o. female presenting with eye problem. The history is provided by the patient. No language interpreter was used.  Eye Problem Location:  R eye Quality:  Aching Severity:  Moderate Onset quality:  Gradual Duration:  5 days Timing:  Constant Progression:  Worsening Chronicity:  New Relieved by:  Nothing Worsened by:  Nothing tried Ineffective treatments:  None tried Associated symptoms: redness   Pt complains of right eye irritation since Tuesday.    Past Medical History  Diagnosis Date  . Hypertension   . Thyroid disease     hypothyroid  . Seasonal allergies    Past Surgical History  Procedure Laterality Date  . Refractive surgery  5-12    BILATERAL  . Gynecologic cryosurgery    . Endometrial ablation  3/13    Cryoablation   Family History  Problem Relation Age of Onset  . Hypertension Mother   . Thyroid disease Mother   . Diabetes Paternal Grandmother   . Hypertension Father    History  Substance Use Topics  . Smoking status: Never Smoker   . Smokeless tobacco: Never Used  . Alcohol Use: No   OB History   Grav Para Term Preterm Abortions TAB SAB Ect Mult Living   2 2        2      Review of Systems  HENT: Positive for facial swelling.   Eyes: Positive for pain and redness.  All other systems reviewed and are negative.    Allergies  Review of patient's allergies indicates no known allergies.  Home Medications   Current Outpatient Rx  Name  Route  Sig  Dispense  Refill  . Cetirizine HCl (ZYRTEC PO)   Oral   Take by mouth.         . levothyroxine (SYNTHROID) 88 MCG tablet   Oral   Take 1 tablet (88 mcg total) by mouth daily.   90 tablet   3   .  ofloxacin (OCUFLOX) 0.3 % ophthalmic solution   Right Eye   Place 1 drop into the right eye 4 (four) times daily.         . Pseudoephedrine HCl (SUDAFED PO)   Oral   Take by mouth.          BP 147/105  Pulse 73  Temp(Src) 98.5 F (36.9 C) (Oral)  Resp 20  Ht 5\' 5"  (1.651 m)  Wt 165 lb (74.844 kg)  BMI 27.46 kg/m2  SpO2 98%  LMP 02/08/2013 Physical Exam  Nursing note and vitals reviewed. Constitutional: She is oriented to person, place, and time. She appears well-developed and well-nourished.  HENT:  Head: Normocephalic.  Right Ear: External ear normal.  Left Ear: External ear normal.  Nose: Nose normal.  Mouth/Throat: Oropharynx is clear and moist.  Eyes: EOM are normal. Pupils are equal, round, and reactive to light.  Injected conjunctiva,  No foreign body, no uptake,  Swelling face below eye  Neck: Normal range of motion.  Cardiovascular: Normal rate.   Pulmonary/Chest: Effort normal.  Musculoskeletal: Normal range of motion.  Neurological: She is alert and oriented to person, place, and time.  Skin: Skin is warm.  Psychiatric: She has a normal mood and affect.  ED Course  Procedures (including critical care time) Labs Review Labs Reviewed - No data to display Imaging Review No results found.  MDM   1. Cellulitis, face    Pt given rocephin Im.   Rx for keflex and Bactrim.   I advised stop eye drops  Return here for recheck tomorrow    Elson Areas, PA-C 04/15/13 2159

## 2013-04-15 NOTE — ED Provider Notes (Signed)
Medical screening examination/treatment/procedure(s) were performed by non-physician practitioner and as supervising physician I was immediately available for consultation/collaboration.   Sadako Cegielski, MD 04/15/13 2316 

## 2013-04-15 NOTE — ED Notes (Signed)
Right eye irritation since Tuesday- d/c with corneal abrasion by eye doctor yesterday and started on Ofloxacin- reports new "heat and swelling" under right eye

## 2013-04-17 ENCOUNTER — Emergency Department (HOSPITAL_BASED_OUTPATIENT_CLINIC_OR_DEPARTMENT_OTHER): Payer: BC Managed Care – PPO

## 2013-04-17 ENCOUNTER — Emergency Department (HOSPITAL_BASED_OUTPATIENT_CLINIC_OR_DEPARTMENT_OTHER)
Admission: EM | Admit: 2013-04-17 | Discharge: 2013-04-17 | Disposition: A | Discharge status: Home / Self Care | Payer: BC Managed Care – PPO

## 2013-04-17 ENCOUNTER — Encounter (HOSPITAL_BASED_OUTPATIENT_CLINIC_OR_DEPARTMENT_OTHER): Payer: Self-pay | Admitting: Emergency Medicine

## 2013-04-17 ENCOUNTER — Emergency Department (HOSPITAL_BASED_OUTPATIENT_CLINIC_OR_DEPARTMENT_OTHER)
Admission: EM | Admit: 2013-04-17 | Discharge: 2013-04-17 | Disposition: A | Discharge status: Home / Self Care | Payer: BC Managed Care – PPO | Attending: Emergency Medicine | Admitting: Emergency Medicine

## 2013-04-17 DIAGNOSIS — Z79899 Other long term (current) drug therapy: Secondary | ICD-10-CM | POA: Insufficient documentation

## 2013-04-17 DIAGNOSIS — Z9889 Other specified postprocedural states: Secondary | ICD-10-CM | POA: Insufficient documentation

## 2013-04-17 DIAGNOSIS — I10 Essential (primary) hypertension: Secondary | ICD-10-CM | POA: Insufficient documentation

## 2013-04-17 DIAGNOSIS — L03211 Cellulitis of face: Secondary | ICD-10-CM | POA: Insufficient documentation

## 2013-04-17 DIAGNOSIS — E039 Hypothyroidism, unspecified: Secondary | ICD-10-CM | POA: Insufficient documentation

## 2013-04-17 DIAGNOSIS — L0201 Cutaneous abscess of face: Secondary | ICD-10-CM | POA: Insufficient documentation

## 2013-04-17 MED ORDER — IOHEXOL 300 MG/ML  SOLN
80.0000 mL | Freq: Once | INTRAMUSCULAR | Status: AC | PRN
  Administered 2013-04-17: 80 mL via INTRAVENOUS

## 2013-04-17 NOTE — ED Provider Notes (Signed)
CSN: [id number]     Arrival date & time 04/17/13  0906 History   First MD Initiated Contact with Patient 04/17/13 0913     Chief Complaint  Patient presents with  . Eye Problem   (Consider location/radiation/quality/duration/timing/severity/associated sxs/prior Treatment) HPI Comments: Pt states that one week ago she started with right eye redness and was seen by her pcp and put on eye drops for a corneal abrasion:pt states that she was seen again in the er 2 days ago for right sided facial swelling and redness:pt eye drops were stopped and pt was put on bactrim and keflex:pt states that the redness has resolved but not she thinks that the other eye is red and swelling:denies fever  The history is provided by the patient. No language interpreter was used.    Past Medical History  Diagnosis Date  . Hypertension   . Thyroid disease     hypothyroid  . Seasonal allergies    Past Surgical History  Procedure Laterality Date  . Refractive surgery  5-12    BILATERAL  . Gynecologic cryosurgery    . Endometrial ablation  3/13    Cryoablation   Family History  Problem Relation Age of Onset  . Hypertension Mother   . Thyroid disease Mother   . Diabetes Paternal Grandmother   . Hypertension Father    History  Substance Use Topics  . Smoking status: Never Smoker   . Smokeless tobacco: Never Used  . Alcohol Use: No   OB History   Grav Para Term Preterm Abortions TAB SAB Ect Mult Living   2 2        2      Review of Systems  Constitutional: Negative.   Respiratory: Negative.   Cardiovascular: Negative.     Allergies  Review of patient's allergies indicates no known allergies.  Home Medications   Current Outpatient Rx  Name  Route  Sig  Dispense  Refill  . cephALEXin (KEFLEX) 500 MG capsule   Oral   Take 1 capsule (500 mg total) by mouth 4 (four) times daily.   40 capsule   0   . Cetirizine HCl (ZYRTEC PO)   Oral   Take by mouth.         . levothyroxine (SYNTHROID)  88 MCG tablet   Oral   Take 1 tablet (88 mcg total) by mouth daily.   90 tablet   3   . ofloxacin (OCUFLOX) 0.3 % ophthalmic solution   Right Eye   Place 1 drop into the right eye 4 (four) times daily.         . Pseudoephedrine HCl (SUDAFED PO)   Oral   Take by mouth.         . sulfamethoxazole-trimethoprim (SEPTRA DS) 800-160 MG per tablet   Oral   Take 1 tablet by mouth every 12 (twelve) hours.   10 tablet   0    BP 144/99  Pulse 78  Temp(Src) 98.6 F (37 C) (Oral)  Resp 20  Ht 5\' 5"  (1.651 m)  Wt 165 lb (74.844 kg)  BMI 27.46 kg/m2  SpO2 99% Physical Exam  Nursing note and vitals reviewed. Constitutional: She appears well-developed and well-nourished.  HENT:  Head: Normocephalic and atraumatic.  Right Ear: External ear normal.  Left Ear: External ear normal.  Mouth/Throat: Oropharynx is clear and moist.  Eyes: EOM are normal. Pupils are equal, round, and reactive to light. Right eye exhibits exudate. Left eye exhibits exudate. Right conjunctiva is  injected. Left conjunctiva is injected.  Clear drainage noted from eyes bilaterally:mild swelling noted below the right eye:no swelling noted below the left eye  Neck: Normal range of motion. Neck supple.  Cardiovascular: Normal rate and regular rhythm.   Pulmonary/Chest: Effort normal and breath sounds normal.    ED Course  Procedures (including critical care time) Labs Review Labs Reviewed - No data to display Imaging Review Ct Maxillofacial W/cm  04/17/2013   *RADIOLOGY REPORT*  Clinical Data: Soft tissue swelling and I drainage  CT MAXILLOFACIAL WITH CONTRAST  Technique:  Multidetector CT imaging of the maxillofacial structures was performed with intravenous contrast. Multiplanar CT image reconstructions were also generated.  Contrast: 80mL OMNIPAQUE IOHEXOL 300 MG/ML  SOLN  Comparison: None.  Findings: Paranasal sinuses are well-aerated.  The bony structures are within normal limits.  Mild soft tissue swelling  is noted over the right eye consistent with preseptal cellulitis and the patient's given clinical history.  No focal abscess is seen.  The orbits in their contents are unremarkable.  No other soft tissue abnormality is noted.  IMPRESSION: Changes consistent with preseptal cellulitis on the right.  No intraorbital abnormality is noted at this time.   Original Report Authenticated By: Alcide Clever, M.D.    MDM   1. Facial cellulitis    No abscess noted on ct:pt states that the right side is somewhat better:will continue on antibiotics as pt has only had one days worth    Teressa Lower, NP 04/17/13 1031

## 2013-04-17 NOTE — ED Provider Notes (Signed)
Medical screening examination/treatment/procedure(s) were performed by non-physician practitioner and as supervising physician I was immediately available for consultation/collaboration.   Megan E Docherty, MD 04/17/13 1654 

## 2013-04-17 NOTE — ED Notes (Signed)
MD at bedside. 

## 2013-04-17 NOTE — ED Notes (Signed)
Patient transported to X-ray 

## 2013-04-17 NOTE — ED Notes (Signed)
Pt presents with worsening symptoms of eye drainage and facial swelling: Pt was initially seen 5-6 days ago and diagnosed with corneal abrasion, given eye drops, then came here 2days ago and drops were stopped, given abx treatment for which pt sts some of swelling in face/cheek bone/jaw line had decreased; but now has increased swelling again to R cheek bone and now has erythema and swelling of left eye also.

## 2013-05-22 ENCOUNTER — Telehealth: Payer: Self-pay | Admitting: *Deleted

## 2013-05-22 MED ORDER — LEVOTHYROXINE SODIUM 88 MCG PO TABS: 88.0000 ug | ORAL_TABLET | Freq: Every day | ORAL | Status: DC

## 2013-05-22 NOTE — Telephone Encounter (Signed)
Pt called requesting 90 day supply of synthroid 88 mcg, pt has new pharmacy. rx will be sent.

## 2013-08-28 ENCOUNTER — Telehealth: Payer: Self-pay | Admitting: *Deleted

## 2013-08-28 MED ORDER — LEVOTHYROXINE SODIUM 88 MCG PO TABS: 88.0000 ug | ORAL_TABLET | Freq: Every day | ORAL | Status: DC

## 2013-08-28 NOTE — Telephone Encounter (Signed)
Pt called requesting refill on synthroid 88 mcg tablet, rx sent.

## 2013-10-24 ENCOUNTER — Other Ambulatory Visit: Payer: Self-pay | Admitting: Women's Health

## 2013-10-24 DIAGNOSIS — Z1231 Encounter for screening mammogram for malignant neoplasm of breast: Secondary | ICD-10-CM

## 2013-10-27 ENCOUNTER — Encounter: Payer: Self-pay | Admitting: Women's Health

## 2013-10-31 ENCOUNTER — Ambulatory Visit (INDEPENDENT_AMBULATORY_CARE_PROVIDER_SITE_OTHER): Payer: BC Managed Care – PPO | Admitting: Women's Health

## 2013-10-31 ENCOUNTER — Encounter: Payer: Self-pay | Admitting: Women's Health

## 2013-10-31 VITALS — BP 110/70 | Ht 65.5 in | Wt 170.0 lb

## 2013-10-31 DIAGNOSIS — Z01419 Encounter for gynecological examination (general) (routine) without abnormal findings: Secondary | ICD-10-CM

## 2013-10-31 NOTE — Patient Instructions (Signed)

## 2013-10-31 NOTE — Progress Notes (Signed)
Bethany Mata 1971/05/16 [id number]    History:    Presents for annual exam.  Irregular mostly monthly cycles/vasectomy. 10/2011 ablation with good relief of menorrhagia. 1990s cryo- with normal Paps after. Hypertension and hypothyroid managed by primary care.  Past medical history, past surgical history, family history and social history were all reviewed and documented in the EPIC chart. Works at [employer]. 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 Parents hypertension, mother hypothyroid.  ROS:  A  ROS was performed and pertinent positives and negatives are included.  Exam:  Filed Vitals:   10/31/13 1552  BP: 110/70    General appearance:  Normal Thyroid:  Symmetrical, normal in size, without palpable masses or nodularity. Respiratory  Auscultation:  Clear without wheezing or rhonchi Cardiovascular  Auscultation:  Regular rate, without rubs, murmurs or gallops  Edema/varicosities:  Not grossly evident Abdominal  Soft,nontender, without masses, guarding or rebound.  Liver/spleen:  No organomegaly noted  Hernia:  None appreciated  Skin  Inspection:  Grossly normal   Breasts: Examined lying and sitting.     Right: Without masses, retractions, discharge or axillary adenopathy.     Left: Without masses, retractions, discharge or axillary adenopathy. Gentitourinary   Inguinal/mons:  Normal without inguinal adenopathy  External genitalia:  Normal  BUS/Urethra/Skene's glands:  Normal  Vagina:  Normal  Cervix:  Normal  Uterus:   normal in size, shape and contour.  Midline and mobile  Adnexa/parametria:     Rt: Without masses or tenderness.   Lt: Without masses or tenderness.  Anus and perineum: Normal  Digital rectal exam: Normal sphincter tone without palpated masses or tenderness  Assessment/Plan:  43 y.o.MWF G2P2  for annual exam with no  complaints.  Hypertension/hypothyroid-primary care manages labs and meds 10/2011 ablation good relief of menorrhagia  Plan: SBE's, continue annual mammogram, calcium rich diet, vitamin D 1000 daily. Increase regular exercise and decrease calories for weight loss, Pap normal 2014, new screening guidelines reviewed.   Bethany Mata [facility], 4:38 PM 10/31/2013

## 2014-03-21 ENCOUNTER — Ambulatory Visit (HOSPITAL_COMMUNITY)
Admission: RE | Admit: 2014-03-21 | Discharge: 2014-03-21 | Disposition: A | Discharge status: Home / Self Care | Payer: BC Managed Care – PPO | Source: Ambulatory Visit | Attending: Women's Health | Admitting: Women's Health

## 2014-03-21 DIAGNOSIS — Z1231 Encounter for screening mammogram for malignant neoplasm of breast: Secondary | ICD-10-CM | POA: Insufficient documentation

## 2014-04-05 ENCOUNTER — Encounter: Payer: BC Managed Care – PPO | Admitting: Women's Health

## 2014-06-18 ENCOUNTER — Encounter: Payer: Self-pay | Admitting: Women's Health

## 2014-06-22 ENCOUNTER — Other Ambulatory Visit: Payer: Self-pay | Admitting: Family Medicine

## 2014-06-22 ENCOUNTER — Ambulatory Visit
Admission: RE | Admit: 2014-06-22 | Discharge: 2014-06-22 | Disposition: A | Discharge status: Home / Self Care | Payer: BC Managed Care – PPO | Source: Ambulatory Visit | Attending: Family Medicine | Admitting: Family Medicine

## 2014-06-22 DIAGNOSIS — R059 Cough, unspecified: Secondary | ICD-10-CM

## 2014-06-22 DIAGNOSIS — R05 Cough: Secondary | ICD-10-CM

## 2014-07-27 ENCOUNTER — Ambulatory Visit (INDEPENDENT_AMBULATORY_CARE_PROVIDER_SITE_OTHER): Payer: BC Managed Care – PPO | Admitting: Internal Medicine

## 2014-07-27 ENCOUNTER — Encounter (INDEPENDENT_AMBULATORY_CARE_PROVIDER_SITE_OTHER): Payer: Self-pay

## 2014-07-27 ENCOUNTER — Encounter: Payer: Self-pay | Admitting: Internal Medicine

## 2014-07-27 ENCOUNTER — Other Ambulatory Visit (INDEPENDENT_AMBULATORY_CARE_PROVIDER_SITE_OTHER): Payer: BC Managed Care – PPO

## 2014-07-27 VITALS — BP 124/86 | HR 87 | Ht 65.0 in | Wt 155.0 lb

## 2014-07-27 DIAGNOSIS — R05 Cough: Secondary | ICD-10-CM

## 2014-07-27 DIAGNOSIS — R059 Cough, unspecified: Secondary | ICD-10-CM

## 2014-07-27 LAB — CBC WITH DIFFERENTIAL/PLATELET
Basophils Absolute: 0 10*3/uL (ref 0.0–0.1)
Basophils Relative: 0.4 % (ref 0.0–3.0)
EOS PCT: 1.4 % (ref 0.0–5.0)
Eosinophils Absolute: 0.1 10*3/uL (ref 0.0–0.7)
HEMATOCRIT: 42.9 % (ref 36.0–46.0)
Hemoglobin: 14.2 g/dL (ref 12.0–15.0)
LYMPHS ABS: 3.6 10*3/uL (ref 0.7–4.0)
LYMPHS PCT: 36.1 % (ref 12.0–46.0)
MCHC: 33.1 g/dL (ref 30.0–36.0)
MCV: 93.9 fl (ref 78.0–100.0)
MONOS PCT: 7.7 % (ref 3.0–12.0)
Monocytes Absolute: 0.8 10*3/uL (ref 0.1–1.0)
Neutro Abs: 5.5 10*3/uL (ref 1.4–7.7)
Neutrophils Relative %: 54.4 % (ref 43.0–77.0)
PLATELETS: 224 10*3/uL (ref 150.0–400.0)
RBC: 4.57 Mil/uL (ref 3.87–5.11)
RDW: 12.9 % (ref 11.5–15.5)
WBC: 10 10*3/uL (ref 4.0–10.5)

## 2014-07-27 MED ORDER — PREDNISONE 10 MG PO TABS: ORAL_TABLET | ORAL | Status: DC

## 2014-07-27 MED ORDER — FAMOTIDINE 20 MG PO TABS: ORAL_TABLET | ORAL | Status: DC

## 2014-07-27 MED ORDER — PANTOPRAZOLE SODIUM 40 MG PO TBEC
40.0000 mg | DELAYED_RELEASE_TABLET | Freq: Every day | ORAL | Status: DC
Start: 2014-07-27 — End: 2016-01-21

## 2014-07-27 NOTE — Progress Notes (Signed)
Subjective:    Patient ID: Bethany Mata, female    DOB: Jan 12, 1971,    MRN: [id number]  HPI  64 yowf never smoker never problems as child then young adult started with fall > spring nasal congestion rx clariton / flonase and several episodes since  2011 of fall cough "minor wheezing" on prn saba and prednisone which helped a lot then mid September 2015 recrurrent persistent daily cough  > Oct 20 eval by Dr white rx pred / inhaler= proair and pred and no better > symbicort no better >  referred to pulmonary clinic 07/27/2014    07/27/2014 1st [facility] office visit/ Janeil Schexnayder   Chief Complaint  Patient presents with  . Pulmonary Consult    Self referral for eval of pulmonary nodule and cough. Pt c/o cough for the past 2-3 months. Cough is non prod and worse when exposed to strong smells, cold weather and sometimes after eating.   cough is dry, ? Best rx is prednisone but only transient and partial. Using lots of cough drops seem to help only while in mouth. saba helps some but makes her tremulous. symbicort didn't help. Cough worse day than noct Worse with eating or exp to strong smells or cold air but not a consistent pattern. Assoc with sense of pnds and some nasal congestion but no excess mucus, no better on clariton.  No obvious other patterns in day to day or daytime variabilty or assoc sob or cp or chest tightness, subjective wheeze or overt   hb symptoms. No unusual exp hx or h/o childhood pna/ asthma or knowledge of premature birth.  Sleeping ok without nocturnal  or early am exacerbation  of respiratory  c/o's or need for noct saba. Also denies any obvious fluctuation of symptoms with weather or environmental changes or other aggravating or alleviating factors except as outlined above   Current Medications, Allergies, Complete Past Medical History, Past Surgical History, Family History, and Social History were reviewed in [employer] record.             Review of Systems  Constitutional: Negative for fever, chills and unexpected weight change.  HENT: Negative for congestion, dental problem, ear pain, nosebleeds, postnasal drip, rhinorrhea, sinus pressure, sneezing, sore throat, trouble swallowing and voice change.   Eyes: Negative for visual disturbance.  Respiratory: Positive for cough. Negative for choking and shortness of breath.   Cardiovascular: Negative for chest pain and leg swelling.  Gastrointestinal: Negative for vomiting, abdominal pain and diarrhea.  Genitourinary: Negative for difficulty urinating.  Musculoskeletal: Negative for arthralgias.  Skin: Negative for rash.  Neurological: Negative for tremors, syncope and headaches.  Hematological: Does not bruise/bleed easily.       Objective:   Physical Exam  Wt Readings from Last 3 Encounters:  07/27/14 155 lb (70.308 kg)  10/31/13 170 lb (77.111 kg)  04/17/13 165 lb (74.844 kg)    Vital signs reviewed    HEENT: nl dentition, turbinates, and orophanx except for minimal cobblestoning. Nl external ear canals without cough reflex   NECK :  without JVD/Nodes/TM/ nl carotid upstrokes bilaterally   LUNGS: no acc muscle use, clear to A and P bilaterally without cough on insp or exp maneuvers   CV:  RRR  no s3 or murmur or increase in P2, no edema   ABD:  soft and nontender with nl excursion in the supine position. No bruits or organomegaly, bowel sounds nl  MS:  warm without deformities, calf tenderness,  cyanosis or clubbing  SKIN: warm and dry without lesions    NEURO:  alert, approp, no deficits   cxr 06/22/14 There is no active cardiopulmonary disease. Soft tissue density projecting over the left upper hemi- thorax is nonspecific in appearance. Correlation with the patient's exam is needed.   CXR PA and Lateral:   07/27/2014 : Wants to wait        Assessment & Plan:

## 2014-07-27 NOTE — Patient Instructions (Addendum)
Pantoprazole (protonix) 40 mg   Take 30-60 min before first meal of the day and Pepcid 20 mg one bedtime until return to office - this is the best way to tell whether stomach acid is contributing to your problem.    GERD (REFLUX)  is an extremely common cause of respiratory symptoms just like yours , many times with no obvious heartburn at all.    It can be treated with medication, but also with lifestyle changes including avoidance of late meals, excessive alcohol, smoking cessation, and avoid fatty foods, chocolate, peppermint, colas, red wine, and acidic juices such as orange juice.  NO MINT OR MENTHOL PRODUCTS SO NO COUGH DROPS  USE SUGARLESS CANDY INSTEAD (Jolley ranchers or Stover's or Life Savers) or even ice chips will also do - the key is to swallow to prevent all throat clearing. NO OIL BASED VITAMINS - use powdered substitutes.  Prednisone 10 mg take  4 each am x 2 days,   2 each am x 2 days,  1 each am x 2 days and stop   Stop clariton and For drainage/itching sneezing try    chlortrimeton (chlorpheniramine) 4 mg every 4 hours available over the counter (may cause drowsiness)   Please remember to go to the lab and x-ray department downstairs for your tests - we will call you with the results when they are available.  Please see patient coordinator before you leave today  to schedule asthma challenge test in 4 weeks no sooner

## 2014-07-28 NOTE — Assessment & Plan Note (Addendum)
The most common causes of chronic cough in immunocompetent adults include the following: upper airway cough syndrome (UACS), previously referred to as postnasal drip syndrome (PNDS), which is caused by variety of rhinosinus conditions; (2) asthma; (3) GERD; (4) chronic bronchitis from cigarette smoking or other inhaled environmental irritants; (5) nonasthmatic eosinophilic bronchitis; and (6) bronchiectasis.   These conditions, singly or in combination, have accounted for up to 94% of the causes of chronic cough in prospective studies.   Other conditions have constituted no >6% of the causes in prospective studies These have included bronchogenic carcinoma, chronic interstitial pneumonia, sarcoidosis, left ventricular failure, ACEI-induced cough, and aspiration from a condition associated with pharyngeal dysfunction.    Chronic cough is often simultaneously caused by more than one condition. A single cause has been found from 38 to 82% of the time, multiple causes from 18 to 62%. Multiply caused cough has been the result of three diseases up to 42% of the time.       Based on hx and exam, this is most likely:  Cough variant asthma (suggested by partial response to prednisone) vs   Upper airway cough syndrome, so named because it's frequently impossible to sort out how much is  CR/sinusitis with freq throat clearing (which can be related to primary GERD)   vs  causing  secondary (" extra esophageal")  GERD from wide swings in gastric pressure that occur with throat clearing, often  promoting self use of mint and menthol lozenges that reduce the lower esophageal sphincter tone and exacerbate the problem further in a cyclical fashion.   These are the same pts (now being labeled as having "irritable larynx syndrome" by some cough centers) who not infrequently have a history of having failed to tolerate ace inhibitors,  dry powder inhalers or biphosphonates or report having atypical reflux symptoms that don't  respond to standard doses of PPI , and are easily confused as having aecopd or asthma flares by even experienced allergists/ pulmonologists.   The first step is to repeat the short course of pred that previously helped but also  maximize acid suppression and then proceed with methacholine challenge while still on gerd rx and add 1st gen H1 per guidelines  Allergy profile and repeat cxr also rec   See instructions for specific recommendations which were reviewed directly with the patient who was given a copy with highlighter outlining the key components.

## 2014-07-30 ENCOUNTER — Ambulatory Visit (INDEPENDENT_AMBULATORY_CARE_PROVIDER_SITE_OTHER)
Admission: RE | Admit: 2014-07-30 | Discharge: 2014-07-30 | Disposition: A | Discharge status: Home / Self Care | Payer: BC Managed Care – PPO | Source: Ambulatory Visit | Attending: Internal Medicine | Admitting: Internal Medicine

## 2014-07-30 ENCOUNTER — Encounter (INDEPENDENT_AMBULATORY_CARE_PROVIDER_SITE_OTHER): Payer: Self-pay

## 2014-07-30 DIAGNOSIS — R05 Cough: Secondary | ICD-10-CM

## 2014-07-30 DIAGNOSIS — R059 Cough, unspecified: Secondary | ICD-10-CM

## 2014-07-30 LAB — ALLERGY FULL PROFILE
Bahia Grass: <0.1 kU/L
Bermuda Grass: <0.1 kU/L
Box Elder IgE: <0.1 kU/L
CAT DANDER: 1.51 kU/L — AB
Candida Albicans: <0.1 kU/L
Curvularia lunata: <0.1 kU/L
DOG DANDER: 0.35 kU/L — AB
Elm IgE: <0.1 kU/L
Fescue: <0.1 kU/L
G009 Red Top: <0.1 kU/L
HOUSE DUST HOLLISTER: 0.4 kU/L — AB
IGE (IMMUNOGLOBULIN E), SERUM: 34 kU/L (ref <115)
Oak: <0.1 kU/L
Timothy Grass: <0.1 kU/L

## 2014-07-30 NOTE — Progress Notes (Signed)
Quick Note:  Spoke with pt and notified of results per Dr. Wert. Pt verbalized understanding and denied any questions.  ______ 

## 2014-07-31 NOTE — Progress Notes (Signed)
Quick Note:  LMTCB ______ 

## 2014-07-31 NOTE — Progress Notes (Signed)
Quick Note:  Spoke with pt and notified of results per Dr. Wert. Pt verbalized understanding and denied any questions.  ______ 

## 2014-08-28 ENCOUNTER — Encounter (HOSPITAL_COMMUNITY): Payer: BC Managed Care – PPO

## 2014-09-04 ENCOUNTER — Encounter (HOSPITAL_COMMUNITY): Payer: BC Managed Care – PPO

## 2014-12-20 ENCOUNTER — Ambulatory Visit (INDEPENDENT_AMBULATORY_CARE_PROVIDER_SITE_OTHER): Payer: BLUE CROSS/BLUE SHIELD | Admitting: Women's Health

## 2014-12-20 ENCOUNTER — Encounter: Payer: Self-pay | Admitting: Women's Health

## 2014-12-20 VITALS — BP 115/76 | Ht 66.0 in | Wt 155.8 lb

## 2014-12-20 DIAGNOSIS — N912 Amenorrhea, unspecified: Secondary | ICD-10-CM | POA: Diagnosis not present

## 2014-12-20 DIAGNOSIS — Z01419 Encounter for gynecological examination (general) (routine) without abnormal findings: Secondary | ICD-10-CM | POA: Diagnosis not present

## 2014-12-20 LAB — URINALYSIS W MICROSCOPIC + REFLEX CULTURE
Bilirubin Urine: NEGATIVE
GLUCOSE, UA: NEGATIVE mg/dL
Hgb urine dipstick: NEGATIVE
Ketones, ur: NEGATIVE mg/dL
Leukocytes, UA: NEGATIVE
NITRITE: NEGATIVE
PH: 6 (ref 5.0–8.0)
Protein, ur: NEGATIVE mg/dL
SPECIFIC GRAVITY, URINE: 1.02 (ref 1.005–1.030)
Urobilinogen, UA: 0.2 mg/dL (ref 0.0–1.0)

## 2014-12-20 NOTE — Progress Notes (Signed)
Bethany Mata 12-Feb-1971 [id number]    History:    Presents for annual exam.  11/2011 HER option, irregular cycles after has been amenorrheic now 5 months. Vasectomy. 1990 cryo-with normal Paps after. Normal mammogram history. Hypertension and hypothyroid managed by primary care. Has lost 20 pounds in the past year with diet and exercise.  Past medical history, past surgical history, family history and social history were all reviewed and documented in the EPIC chart. Homemaker. Taylor 20 at [employer] doing well. Logan 80 now living with his father strained relationship. Stepdaughter [name] 11 ADHD doing okay. Parents hypertension, mother hypothyroid.  ROS:  A ROS was performed and pertinent positives and negatives are included.  Exam:  Filed Vitals:   12/20/14 0954  BP: 115/76    General appearance:  Normal Thyroid:  Symmetrical, normal in size, without palpable masses or nodularity. Respiratory  Auscultation:  Clear without wheezing or rhonchi Cardiovascular  Auscultation:  Regular rate, without rubs, murmurs or gallops  Edema/varicosities:  Not grossly evident Abdominal  Soft,nontender, without masses, guarding or rebound.  Liver/spleen:  No organomegaly noted  Hernia:  None appreciated  Skin  Inspection:  Grossly normal   Breasts: Examined lying and sitting.     Right: Without masses, retractions, discharge or axillary adenopathy.     Left: Without masses, retractions, discharge or axillary adenopathy. Gentitourinary   Inguinal/mons:  Normal without inguinal adenopathy  External genitalia:  Normal  BUS/Urethra/Skene's glands:  Normal  Vagina:  Normal  Cervix:  Normal  Uterus:   normal in size, shape and contour.  Midline and mobile  Adnexa/parametria:     Rt: Without masses or tenderness.   Lt: Without masses or tenderness.  Anus and perineum: Normal  Digital rectal exam: Normal sphincter tone without palpated masses or tenderness  Assessment/Plan:  44 y.o. MWF G2P2 +1  stepdaughter full custody for annual exam.    Amenorrhea/her option 11/2011 1990 cryo-with normal Paps after Hypertension and hypothyroid primary care manages labs and meds Situational stress/children  Plan: 1 FSH, has some menopausal symptoms. Reviewed importance of continuing regular exercise, vitamin D 1000 daily encouraged. 2. 2014 Pap normal with negative HR HPV, new screening guidelines reviewed. 3. Encourage counseling for situational stress of teenaged children and dealing with child with ADHD. SBE's, continue annual screening mammogram, due in August. UA    [place], 11:17 AM 12/20/2014

## 2014-12-20 NOTE — Patient Instructions (Signed)

## 2014-12-21 LAB — FOLLICLE STIMULATING HORMONE: FSH: 5.8 m[IU]/mL

## 2015-05-16 ENCOUNTER — Other Ambulatory Visit: Payer: Self-pay

## 2015-05-16 DIAGNOSIS — Z1231 Encounter for screening mammogram for malignant neoplasm of breast: Secondary | ICD-10-CM

## 2015-05-28 ENCOUNTER — Ambulatory Visit: Payer: Self-pay

## 2015-06-04 ENCOUNTER — Ambulatory Visit
Admission: RE | Admit: 2015-06-04 | Discharge: 2015-06-04 | Disposition: A | Discharge status: Home / Self Care | Payer: BLUE CROSS/BLUE SHIELD | Source: Ambulatory Visit

## 2015-06-04 DIAGNOSIS — Z1231 Encounter for screening mammogram for malignant neoplasm of breast: Secondary | ICD-10-CM

## 2015-06-05 ENCOUNTER — Other Ambulatory Visit: Payer: Self-pay | Admitting: Women's Health

## 2015-06-05 DIAGNOSIS — R928 Other abnormal and inconclusive findings on diagnostic imaging of breast: Secondary | ICD-10-CM

## 2015-06-11 ENCOUNTER — Ambulatory Visit
Admission: RE | Admit: 2015-06-11 | Discharge: 2015-06-11 | Disposition: A | Discharge status: Home / Self Care | Payer: BLUE CROSS/BLUE SHIELD | Source: Ambulatory Visit | Attending: Women's Health | Admitting: Women's Health

## 2015-06-11 DIAGNOSIS — R928 Other abnormal and inconclusive findings on diagnostic imaging of breast: Secondary | ICD-10-CM

## 2015-12-09 DIAGNOSIS — I10 Essential (primary) hypertension: Secondary | ICD-10-CM | POA: Diagnosis not present

## 2015-12-09 DIAGNOSIS — E039 Hypothyroidism, unspecified: Secondary | ICD-10-CM | POA: Diagnosis not present

## 2015-12-09 DIAGNOSIS — T753XXA Motion sickness, initial encounter: Secondary | ICD-10-CM | POA: Diagnosis not present

## 2015-12-09 DIAGNOSIS — L729 Follicular cyst of the skin and subcutaneous tissue, unspecified: Secondary | ICD-10-CM | POA: Diagnosis not present

## 2015-12-17 DIAGNOSIS — D485 Neoplasm of uncertain behavior of skin: Secondary | ICD-10-CM | POA: Diagnosis not present

## 2015-12-17 DIAGNOSIS — L720 Epidermal cyst: Secondary | ICD-10-CM | POA: Diagnosis not present

## 2015-12-17 DIAGNOSIS — L812 Freckles: Secondary | ICD-10-CM | POA: Diagnosis not present

## 2015-12-17 DIAGNOSIS — L821 Other seborrheic keratosis: Secondary | ICD-10-CM | POA: Diagnosis not present

## 2016-01-21 ENCOUNTER — Encounter: Payer: Self-pay | Admitting: Women's Health

## 2016-01-21 ENCOUNTER — Ambulatory Visit (INDEPENDENT_AMBULATORY_CARE_PROVIDER_SITE_OTHER): Payer: BLUE CROSS/BLUE SHIELD | Admitting: Women's Health

## 2016-01-21 VITALS — BP 122/80 | Ht 66.0 in | Wt 137.0 lb

## 2016-01-21 DIAGNOSIS — K640 First degree hemorrhoids: Secondary | ICD-10-CM

## 2016-01-21 DIAGNOSIS — Z01419 Encounter for gynecological examination (general) (routine) without abnormal findings: Secondary | ICD-10-CM

## 2016-01-21 MED ORDER — LIDOCAINE-HYDROCORTISONE ACE 3-0.5 % RE CREA: 1.0000 | TOPICAL_CREAM | Freq: Two times a day (BID) | RECTAL | Status: DC

## 2016-01-21 NOTE — Patient Instructions (Signed)
Hemorrhoids  Hemorrhoids are swollen veins around the rectum or anus. There are two types of hemorrhoids:    Internal hemorrhoids. These occur in the veins just inside the rectum. They may poke through to the outside and become irritated and painful.   External hemorrhoids. These occur in the veins outside the anus and can be felt as a painful swelling or hard lump near the anus.  CAUSES   Pregnancy.    Obesity.    Constipation or diarrhea.    Straining to have a bowel movement.    Sitting for long periods on the toilet.   Heavy lifting or other activity that caused you to strain.   Anal intercourse.  SYMPTOMS    Pain.    Anal itching or irritation.    Rectal bleeding.    Fecal leakage.    Anal swelling.    One or more lumps around the anus.   DIAGNOSIS   Your caregiver may be able to diagnose hemorrhoids by visual examination. Other examinations or tests that may be performed include:    Examination of the rectal area with a gloved hand (digital rectal exam).    Examination of anal canal using a small tube (scope).    A blood test if you have lost a significant amount of blood.   A test to look inside the colon (sigmoidoscopy or colonoscopy).  TREATMENT  Most hemorrhoids can be treated at home. However, if symptoms do not seem to be getting better or if you have a lot of rectal bleeding, your caregiver may perform a procedure to help make the hemorrhoids get smaller or remove them completely. Possible treatments include:    Placing a rubber band at the base of the hemorrhoid to cut off the circulation (rubber band ligation).    Injecting a chemical to shrink the hemorrhoid (sclerotherapy).    Using a tool to burn the hemorrhoid (infrared light therapy).    Surgically removing the hemorrhoid (hemorrhoidectomy).    Stapling the hemorrhoid to block blood flow to the tissue (hemorrhoid stapling).   HOME CARE INSTRUCTIONS    Eat foods with fiber, such as whole grains, beans,  nuts, fruits, and vegetables. Ask your doctor about taking products with added fiber in them (fibersupplements).   Increase fluid intake. Drink enough water and fluids to keep your urine clear or pale yellow.    Exercise regularly.    Go to the bathroom when you have the urge to have a bowel movement. Do not wait.    Avoid straining to have bowel movements.    Keep the anal area dry and clean. Use wet toilet paper or moist towelettes after a bowel movement.    Medicated creams and suppositories may be used or applied as directed.    Only take over-the-counter or prescription medicines as directed by your caregiver.    Take warm sitz baths for 15-20 minutes, 3-4 times a day to ease pain and discomfort.    Place ice packs on the hemorrhoids if they are tender and swollen. Using ice packs between sitz baths may be helpful.     Put ice in a plastic bag.     Place a towel between your skin and the bag.     Leave the ice on for 15-20 minutes, 3-4 times a day.    Do not use a donut-shaped pillow or sit on the toilet for long periods. This increases blood pooling and pain.   SEEK MEDICAL CARE IF:     Will watch your condition.  Will get help right away if you are not doing well or get worse.   This information is not intended to replace advice given to you by your health care provider. Make sure you discuss any questions you have with your health care provider.   Document Released: 07/31/2000 Document Revised: 07/20/2012 Document Reviewed: 06/07/2012 Elsevier Interactive Patient Education 2016 [employer]. Health Maintenance, Female Adopting a healthy lifestyle and getting preventive  care can go a long way to promote health and wellness. Talk with your health care provider about what schedule of regular examinations is right for you. This is a good chance for you to check in with your provider about disease prevention and staying healthy. In between checkups, there are plenty of things you can do on your own. Experts have done a lot of research about which lifestyle changes and preventive measures are most likely to keep you healthy. Ask your health care provider for more information. WEIGHT AND DIET  Eat a healthy diet  Be sure to include plenty of vegetables, fruits, low-fat dairy products, and lean protein.  Do not eat a lot of foods high in solid fats, added sugars, or salt.  Get regular exercise. This is one of the most important things you can do for your health.  Most adults should exercise for at least 150 minutes each week. The exercise should increase your heart rate and make you sweat (moderate-intensity exercise).  Most adults should also do strengthening exercises at least twice a week. This is in addition to the moderate-intensity exercise.  Maintain a healthy weight  Body mass index (BMI) is a measurement that can be used to identify possible weight problems. It estimates body fat based on height and weight. Your health care provider can help determine your BMI and help you achieve or maintain a healthy weight.  For females 13 years of age and older:   A BMI below 18.5 is considered underweight.  A BMI of 18.5 to 24.9 is normal.  A BMI of 25 to 29.9 is considered overweight.  A BMI of 30 and above is considered obese.  Watch levels of cholesterol and blood lipids  You should start having your blood tested for lipids and cholesterol at 46 years of age, then have this test every 5 years.  You may need to have your cholesterol levels checked more often if:  Your lipid or cholesterol levels are high.  You are older than 46 years of age.  You are  at high risk for heart disease.  CANCER SCREENING   Lung Cancer  Lung cancer screening is recommended for adults 28-44 years old who are at high risk for lung cancer because of a history of smoking.  A yearly low-dose CT scan of the lungs is recommended for people who:  Currently smoke.  Have quit within the past 15 years.  Have at least a 30-pack-year history of smoking. A pack year is smoking an average of one pack of cigarettes a day for 1 year.  Yearly screening should continue until it has been 15 years since you quit.  Yearly screening should stop if you develop a health problem that would prevent you from having lung cancer treatment.  Breast Cancer  Practice breast self-awareness. This means understanding how your breasts normally appear and feel.  It also means doing regular breast self-exams. Let your health care provider know about any changes, no matter how small.  If you are in your  20s or 30s, you should have a clinical breast exam (CBE) by a health care provider every 1-3 years as part of a regular health exam.  If you are 55 or older, have a CBE every year. Also consider having a breast X-ray (mammogram) every year.  If you have a family history of breast cancer, talk to your health care provider about genetic screening.  If you are at high risk for breast cancer, talk to your health care provider about having an MRI and a mammogram every year.  Breast cancer gene (BRCA) assessment is recommended for women who have family members with BRCA-related cancers. BRCA-related cancers include:  Breast.  Ovarian.  Tubal.  Peritoneal cancers.  Results of the assessment will determine the need for genetic counseling and BRCA1 and BRCA2 testing. Cervical Cancer Your health care provider may recommend that you be screened regularly for cancer of the pelvic organs (ovaries, uterus, and vagina). This screening involves a pelvic examination, including checking for  microscopic changes to the surface of your cervix (Pap test). You may be encouraged to have this screening done every 3 years, beginning at age 35.  For women ages 22-65, health care providers may recommend pelvic exams and Pap testing every 3 years, or they may recommend the Pap and pelvic exam, combined with testing for human papilloma virus (HPV), every 5 years. Some types of HPV increase your risk of cervical cancer. Testing for HPV may also be done on women of any age with unclear Pap test results.  Other health care providers may not recommend any screening for nonpregnant women who are considered low risk for pelvic cancer and who do not have symptoms. Ask your health care provider if a screening pelvic exam is right for you.  If you have had past treatment for cervical cancer or a condition that could lead to cancer, you need Pap tests and screening for cancer for at least 20 years after your treatment. If Pap tests have been discontinued, your risk factors (such as having a new sexual partner) need to be reassessed to determine if screening should resume. Some women have medical problems that increase the chance of getting cervical cancer. In these cases, your health care provider may recommend more frequent screening and Pap tests. Colorectal Cancer  This type of cancer can be detected and often prevented.  Routine colorectal cancer screening usually begins at 46 years of age and continues through 46 years of age.  Your health care provider may recommend screening at an earlier age if you have risk factors for colon cancer.  Your health care provider may also recommend using home test kits to check for hidden blood in the stool.  A small camera at the end of a tube can be used to examine your colon directly (sigmoidoscopy or colonoscopy). This is done to check for the earliest forms of colorectal cancer.  Routine screening usually begins at age 97.  Direct examination of the colon  should be repeated every 5-10 years through 46 years of age. However, you may need to be screened more often if early forms of precancerous polyps or small growths are found. Skin Cancer  Check your skin from head to toe regularly.  Tell your health care provider about any new moles or changes in moles, especially if there is a change in a mole's shape or color.  Also tell your health care provider if you have a mole that is larger than the size of a pencil eraser.  Always use sunscreen. Apply sunscreen liberally and repeatedly throughout the day.  Protect yourself by wearing long sleeves, pants, a wide-brimmed hat, and sunglasses whenever you are outside. HEART DISEASE, DIABETES, AND HIGH BLOOD PRESSURE   High blood pressure causes heart disease and increases the risk of stroke. High blood pressure is more likely to develop in:  People who have blood pressure in the high end of the normal range (130-139/85-89 mm Hg).  People who are overweight or obese.  People who are African American.  If you are 38-70 years of age, have your blood pressure checked every 3-5 years. If you are 67 years of age or older, have your blood pressure checked every year. You should have your blood pressure measured twice--once when you are at a hospital or clinic, and once when you are not at a hospital or clinic. Record the average of the two measurements. To check your blood pressure when you are not at a hospital or clinic, you can use:  An automated blood pressure machine at a pharmacy.  A home blood pressure monitor.  If you are between 40 years and 48 years old, ask your health care provider if you should take aspirin to prevent strokes.  Have regular diabetes screenings. This involves taking a blood sample to check your fasting blood sugar level.  If you are at a normal weight and have a low risk for diabetes, have this test once every three years after 46 years of age.  If you are overweight and  have a high risk for diabetes, consider being tested at a younger age or more often. PREVENTING INFECTION  Hepatitis B  If you have a higher risk for hepatitis B, you should be screened for this virus. You are considered at high risk for hepatitis B if:  You were born in a country where hepatitis B is common. Ask your health care provider which countries are considered high risk.  Your parents were born in a high-risk country, and you have not been immunized against hepatitis B (hepatitis B vaccine).  You have HIV or AIDS.  You use needles to inject street drugs.  You live with someone who has hepatitis B.  You have had sex with someone who has hepatitis B.  You get hemodialysis treatment.  You take certain medicines for conditions, including cancer, organ transplantation, and autoimmune conditions. Hepatitis C  Blood testing is recommended for:  Everyone born from [birth year] through 1966.  Anyone with known risk factors for hepatitis C. Sexually transmitted infections (STIs)  You should be screened for sexually transmitted infections (STIs) including gonorrhea and chlamydia if:  You are sexually active and are younger than 46 years of age.  You are older than 46 years of age and your health care provider tells you that you are at risk for this type of infection.  Your sexual activity has changed since you were last screened and you are at an increased risk for chlamydia or gonorrhea. Ask your health care provider if you are at risk.  If you do not have HIV, but are at risk, it may be recommended that you take a prescription medicine daily to prevent HIV infection. This is called pre-exposure prophylaxis (PrEP). You are considered at risk if:  You are sexually active and do not regularly use condoms or know the HIV status of your partner(s).  You take drugs by injection.  You are sexually active with a partner who has HIV. Talk with your health care  provider about whether you  are at high risk of being infected with HIV. If you choose to begin PrEP, you should first be tested for HIV. You should then be tested every 3 months for as long as you are taking PrEP.  PREGNANCY   If you are premenopausal and you may become pregnant, ask your health care provider about preconception counseling.  If you may become pregnant, take 400 to 800 micrograms (mcg) of folic acid every day.  If you want to prevent pregnancy, talk to your health care provider about birth control (contraception). OSTEOPOROSIS AND MENOPAUSE   Osteoporosis is a disease in which the bones lose minerals and strength with aging. This can result in serious bone fractures. Your risk for osteoporosis can be identified using a bone density scan.  If you are 22 years of age or older, or if you are at risk for osteoporosis and fractures, ask your health care provider if you should be screened.  Ask your health care provider whether you should take a calcium or vitamin D supplement to lower your risk for osteoporosis.  Menopause may have certain physical symptoms and risks.  Hormone replacement therapy may reduce some of these symptoms and risks. Talk to your health care provider about whether hormone replacement therapy is right for you.  HOME CARE INSTRUCTIONS   Schedule regular health, dental, and eye exams.  Stay current with your immunizations.   Do not use any tobacco products including cigarettes, chewing tobacco, or electronic cigarettes.  If you are pregnant, do not drink alcohol.  If you are breastfeeding, limit how much and how often you drink alcohol.  Limit alcohol intake to no more than 1 drink per day for nonpregnant women. One drink equals 12 ounces of beer, 5 ounces of wine, or 1 ounces of hard liquor.  Do not use street drugs.  Do not share needles.  Ask your health care provider for help if you need support or information about quitting drugs.  Tell your health care provider if  you often feel depressed.  Tell your health care provider if you have ever been abused or do not feel safe at home.   This information is not intended to replace advice given to you by your health care provider. Make sure you discuss any questions you have with your health care provider.   Document Released: 02/16/2011 Document Revised: 08/24/2014 Document Reviewed: 07/05/2013 Elsevier Interactive Patient Education [employer].

## 2016-01-21 NOTE — Progress Notes (Signed)
Bethany Mata [birth year] [id number]    History:    Presents for annual exam.  2013 her option cycles now every 2-3 months for several days reports much more manageable had been extremely heavy and frequent prior . Vasectomy. Normal mammogram history. 1990 cryo-with normal Paps after. Hypertension and hypothyroidism managed by primary care. Has lost 20 pounds this past year 40 total over the last 2 years with diet and exercise. Contemplating a breast lift  Past medical history, past surgical history, family history and social history were all reviewed and documented in the EPIC chart. Homemaker. Bethany Mata 20 doing well at [facility], 30 year old son Bethany Mata living with his father still having some behavioral issues. Stepdaughter's 12 doing better now that she is on ADHD medication. Parents hypertension, mother hypothyroid.  ROS:  A ROS was performed and pertinent positives and negatives are included.  Exam:  Filed Vitals:   01/21/16 1155  BP: 122/80    General appearance:  Normal Thyroid:  Symmetrical, normal in size, without palpable masses or nodularity. Respiratory  Auscultation:  Clear without wheezing or rhonchi Cardiovascular  Auscultation:  Regular rate, without rubs, murmurs or gallops  Edema/varicosities:  Not grossly evident Abdominal  Soft,nontender, without masses, guarding or rebound.  Liver/spleen:  No organomegaly noted  Hernia:  None appreciated  Skin  Inspection:  Grossly normal   Breasts: Examined lying and sitting.     Right: Without masses, retractions, discharge or axillary adenopathy.     Left: Without masses, retractions, discharge or axillary adenopathy. Gentitourinary   Inguinal/mons:  Normal without inguinal adenopathy  External genitalia:  Normal  BUS/Urethra/Skene's glands:  Normal  Vagina:  Normal  Cervix:  Normal  Uterus:   normal in size, shape and contour.  Midline and mobile  Adnexa/parametria:     Rt: Without masses or tenderness.   Lt: Without  masses or tenderness.  Anus and perineum: Normal  Digital rectal exam: Normal sphincter tone without palpated masses or tenderness  Assessment/Plan:  45 y.o. MWF G2 P2 +1 stepdaughter for annual exam with no complaints.  Irregular light cycle every 2-3 months-2013 her option/vasectomy 2013 Hypertension/hyperthyroidism-primary care manages labs and meds  Plan: SBE's, continue annual screening mammogram 3-D tomography reviewed and encouraged. Continue healthy exercise and diet routine, reviewed BMI is good at 22. UA, Pap with HR HPV typing, new screening guidelines reviewed.    Bethany Mata [facility], 12:36 PM 01/21/2016

## 2016-01-22 LAB — URINALYSIS W MICROSCOPIC + REFLEX CULTURE
BACTERIA UA: NONE SEEN [HPF]
Bilirubin Urine: NEGATIVE
CASTS: NONE SEEN [LPF]
Crystals: NONE SEEN [HPF]
GLUCOSE, UA: NEGATIVE
Hgb urine dipstick: NEGATIVE
Ketones, ur: NEGATIVE
Leukocytes, UA: NEGATIVE
NITRITE: NEGATIVE
PH: 8 (ref 5.0–8.0)
Protein, ur: NEGATIVE
RBC / HPF: NONE SEEN RBC/HPF (ref <=2)
SPECIFIC GRAVITY, URINE: 1.017 (ref 1.001–1.035)
WBC, UA: NONE SEEN WBC/HPF (ref <=5)
YEAST: NONE SEEN [HPF]

## 2016-01-23 LAB — PAP, TP IMAGING W/ HPV RNA, RFLX HPV TYPE 16,18/45: HPV mRNA, High Risk: NOT DETECTED

## 2016-02-11 DIAGNOSIS — Z6823 Body mass index (BMI) 23.0-23.9, adult: Secondary | ICD-10-CM | POA: Diagnosis not present

## 2016-02-11 DIAGNOSIS — I10 Essential (primary) hypertension: Secondary | ICD-10-CM | POA: Diagnosis not present

## 2016-02-11 DIAGNOSIS — E039 Hypothyroidism, unspecified: Secondary | ICD-10-CM | POA: Diagnosis not present

## 2016-02-11 DIAGNOSIS — N951 Menopausal and female climacteric states: Secondary | ICD-10-CM | POA: Diagnosis not present

## 2016-02-25 DIAGNOSIS — I10 Essential (primary) hypertension: Secondary | ICD-10-CM | POA: Diagnosis not present

## 2016-02-25 DIAGNOSIS — N951 Menopausal and female climacteric states: Secondary | ICD-10-CM | POA: Diagnosis not present

## 2016-03-17 DIAGNOSIS — I10 Essential (primary) hypertension: Secondary | ICD-10-CM | POA: Diagnosis not present

## 2016-03-17 DIAGNOSIS — N951 Menopausal and female climacteric states: Secondary | ICD-10-CM | POA: Diagnosis not present

## 2016-03-17 DIAGNOSIS — M542 Cervicalgia: Secondary | ICD-10-CM | POA: Diagnosis not present

## 2016-04-17 DIAGNOSIS — E039 Hypothyroidism, unspecified: Secondary | ICD-10-CM | POA: Diagnosis not present

## 2016-04-17 DIAGNOSIS — F902 Attention-deficit hyperactivity disorder, combined type: Secondary | ICD-10-CM | POA: Diagnosis not present

## 2016-04-17 DIAGNOSIS — I10 Essential (primary) hypertension: Secondary | ICD-10-CM | POA: Diagnosis not present

## 2016-04-17 DIAGNOSIS — Z79899 Other long term (current) drug therapy: Secondary | ICD-10-CM | POA: Diagnosis not present

## 2016-04-30 ENCOUNTER — Other Ambulatory Visit: Payer: Self-pay | Admitting: Women's Health

## 2016-04-30 ENCOUNTER — Other Ambulatory Visit: Payer: Self-pay | Admitting: Nurse Practitioner

## 2016-04-30 DIAGNOSIS — Z1231 Encounter for screening mammogram for malignant neoplasm of breast: Secondary | ICD-10-CM

## 2016-05-19 DIAGNOSIS — Z23 Encounter for immunization: Secondary | ICD-10-CM | POA: Diagnosis not present

## 2016-06-04 ENCOUNTER — Ambulatory Visit
Admission: RE | Admit: 2016-06-04 | Discharge: 2016-06-04 | Disposition: A | Discharge status: Home / Self Care | Payer: BLUE CROSS/BLUE SHIELD | Source: Ambulatory Visit | Attending: Nurse Practitioner | Admitting: Nurse Practitioner

## 2016-06-04 DIAGNOSIS — Z1231 Encounter for screening mammogram for malignant neoplasm of breast: Secondary | ICD-10-CM

## 2016-06-17 DIAGNOSIS — I10 Essential (primary) hypertension: Secondary | ICD-10-CM | POA: Diagnosis not present

## 2016-06-17 DIAGNOSIS — Z6822 Body mass index (BMI) 22.0-22.9, adult: Secondary | ICD-10-CM | POA: Diagnosis not present

## 2016-06-17 DIAGNOSIS — G4700 Insomnia, unspecified: Secondary | ICD-10-CM | POA: Diagnosis not present

## 2016-06-17 HISTORY — PX: AUGMENTATION MAMMAPLASTY: SUR837

## 2016-06-19 ENCOUNTER — Other Ambulatory Visit: Payer: Self-pay | Admitting: Plastic Surgery

## 2016-06-19 DIAGNOSIS — D485 Neoplasm of uncertain behavior of skin: Secondary | ICD-10-CM | POA: Diagnosis not present

## 2016-06-19 DIAGNOSIS — D225 Melanocytic nevi of trunk: Secondary | ICD-10-CM | POA: Diagnosis not present

## 2016-08-19 IMAGING — CR DG CHEST 2V
2 series · 2 of 2 positions shown · non-contrast
Comparison: None.

CLINICAL DATA: Five weeks of right cough with initially and
low-grade fever

EXAM:
CHEST  2 VIEW

[view not recorded (1 of 2)]
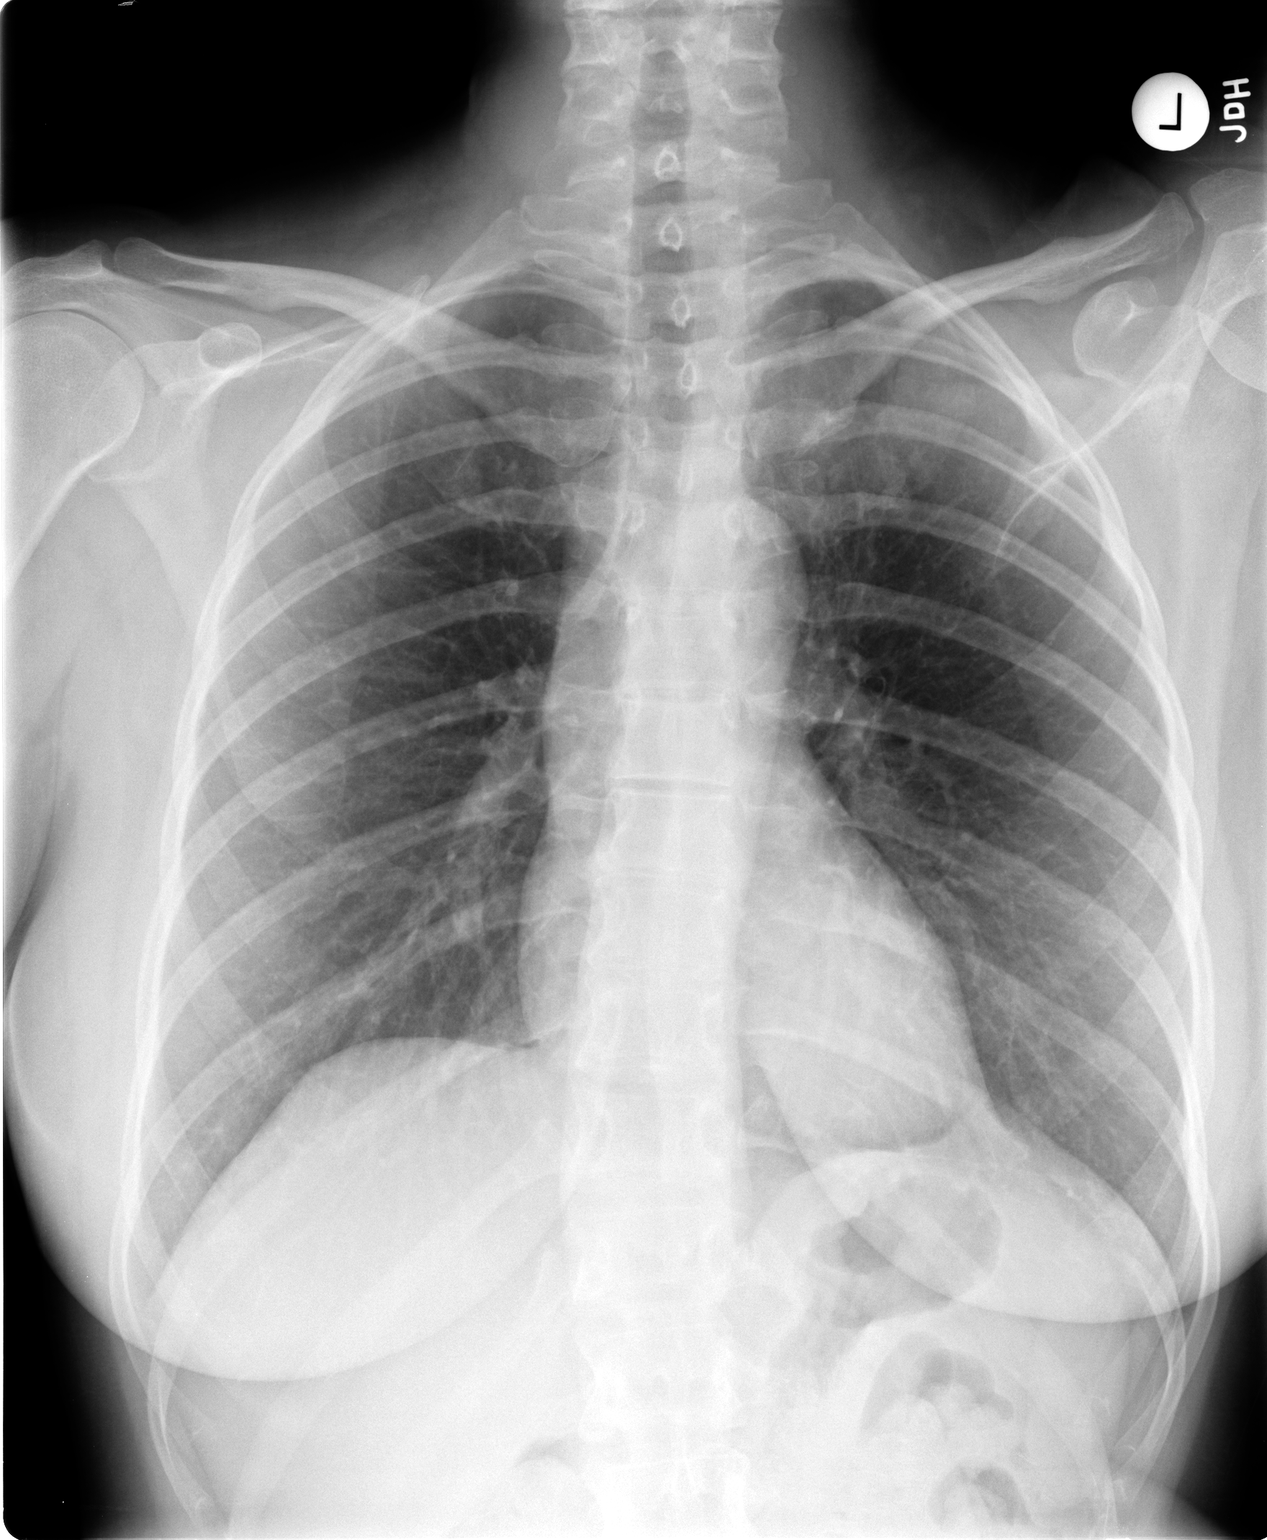

[view not recorded (2 of 2)]
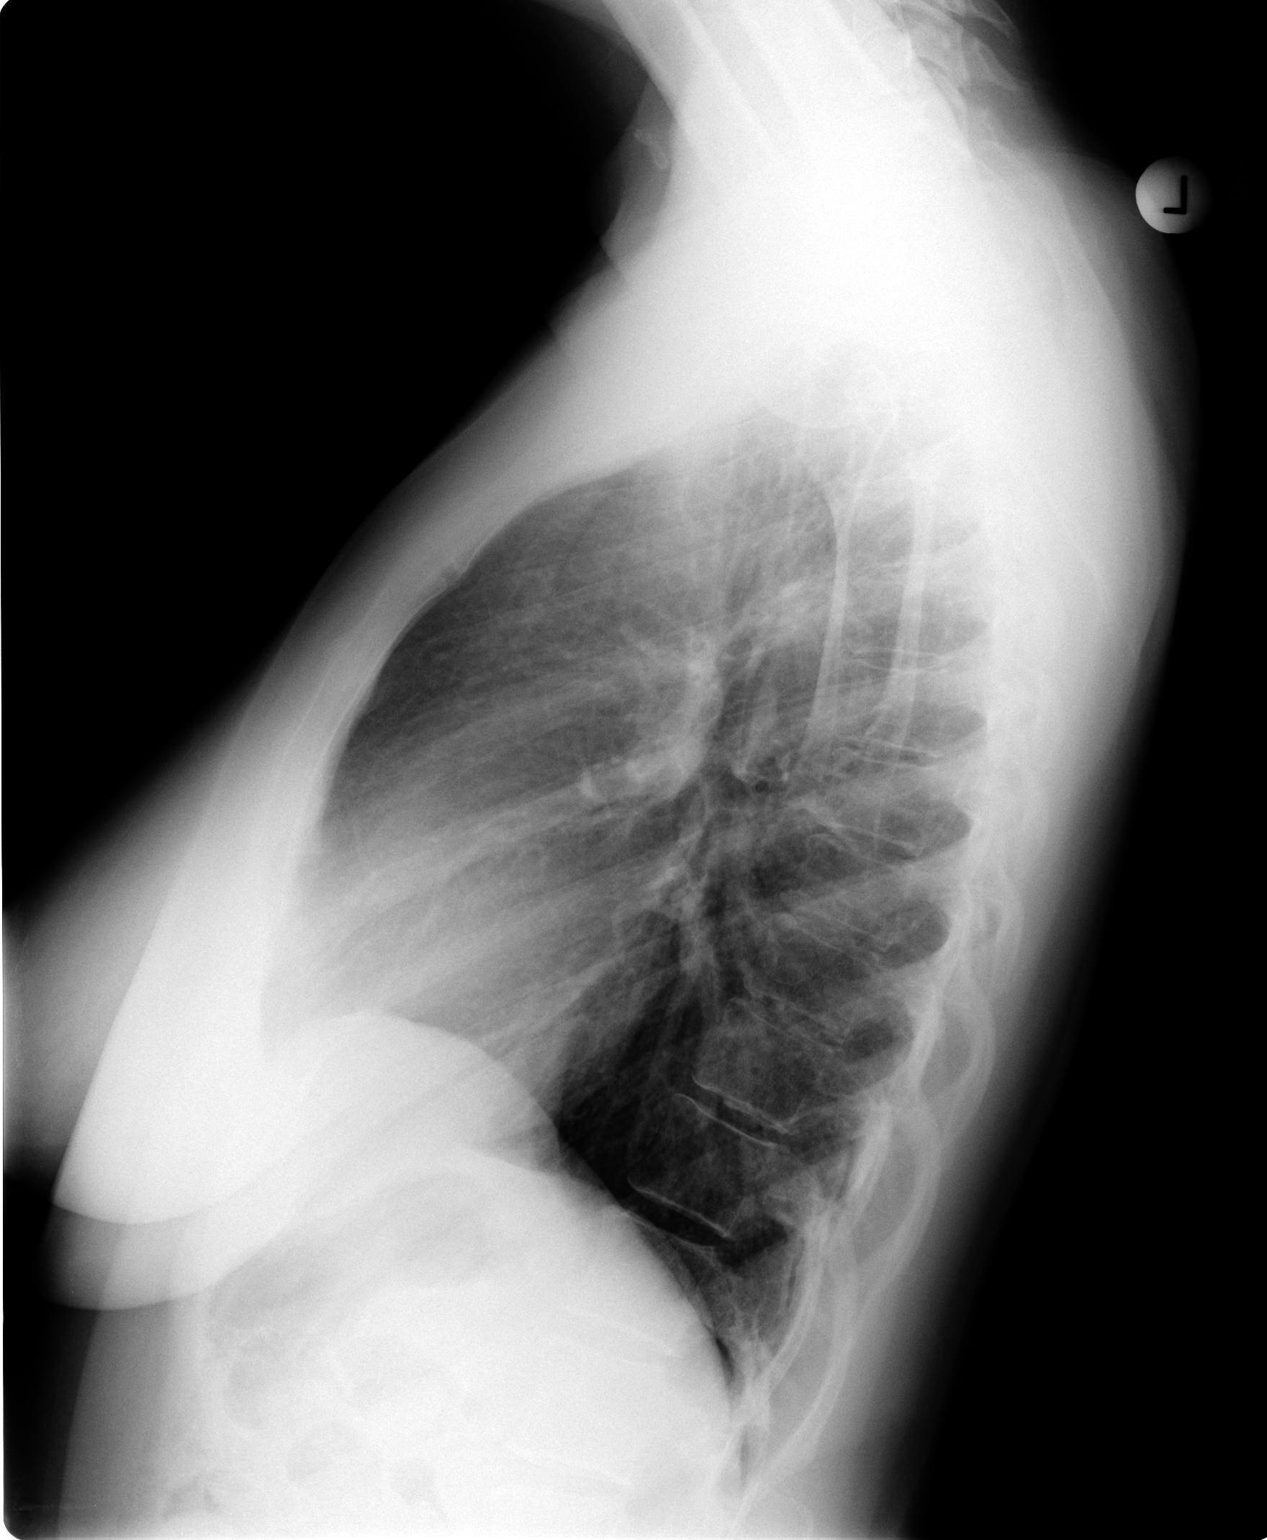

[2 of 2 positions shown; findings below may reference images not displayed]

FINDINGS: The lungs are well-expanded and clear. The heart and pulmonary
vascularity are within the limits of normal. The mediastinum is
normal in width. There is no pleural effusion. There is subtle soft
tissue density projecting of over the upper hemithorax on the
frontal film. Extends outside the confines of the bony thorax and
may be within the anterior or posterior soft. The bony thorax is
unremarkable.
IMPRESSION: There is no active cardiopulmonary disease. Soft tissue density
projecting over the left upper hemi- thorax is nonspecific in
appearance. Correlation with the patient's exam is needed.

## 2016-10-06 DIAGNOSIS — Z79899 Other long term (current) drug therapy: Secondary | ICD-10-CM | POA: Diagnosis not present

## 2016-10-06 DIAGNOSIS — F902 Attention-deficit hyperactivity disorder, combined type: Secondary | ICD-10-CM | POA: Diagnosis not present

## 2016-10-06 DIAGNOSIS — E039 Hypothyroidism, unspecified: Secondary | ICD-10-CM | POA: Diagnosis not present

## 2016-10-06 DIAGNOSIS — I10 Essential (primary) hypertension: Secondary | ICD-10-CM | POA: Diagnosis not present

## 2017-01-05 DIAGNOSIS — Z79899 Other long term (current) drug therapy: Secondary | ICD-10-CM | POA: Diagnosis not present

## 2017-01-05 DIAGNOSIS — F902 Attention-deficit hyperactivity disorder, combined type: Secondary | ICD-10-CM | POA: Diagnosis not present

## 2017-01-05 DIAGNOSIS — I10 Essential (primary) hypertension: Secondary | ICD-10-CM | POA: Diagnosis not present

## 2017-01-25 ENCOUNTER — Ambulatory Visit (INDEPENDENT_AMBULATORY_CARE_PROVIDER_SITE_OTHER): Payer: BLUE CROSS/BLUE SHIELD | Admitting: Women's Health

## 2017-01-25 ENCOUNTER — Encounter: Payer: Self-pay | Admitting: Women's Health

## 2017-01-25 VITALS — BP 138/82 | Ht 65.0 in | Wt 128.0 lb

## 2017-01-25 DIAGNOSIS — Z01419 Encounter for gynecological examination (general) (routine) without abnormal findings: Secondary | ICD-10-CM | POA: Diagnosis not present

## 2017-01-25 NOTE — Progress Notes (Signed)
Bethany Mata 26-Oct-1970 [id number]    History:    Presents for annual exam. C/o sleep disturbance vasomotor symptoms. Diagnosed as premenopausal 1 year ago. Estroven and Melatonin have failed to provide moderate relief of symptoms. Hypertension, ADD and hypothyroidism managed by primary care 2013 her option irregular cycles/vasectomy Reports some stress incontinence. Pap-2017 normal. Mammogram-2017 normal.  Has sustained 40 pound weight loss over the last 3 years with diet and exercise. Breast lift and implants-November 2017  Past medical history, past surgical history, family history and social history were all reviewed and documented in the EPIC chart. 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  ROS:  A ROS was performed and pertinent positives and negatives are included.  Exam:  Vitals:   01/25/17 1046  BP: 138/82  Weight: 128 lb (58.1 kg)  Height: 5\' 5"  (1.651 m)   Body mass index is 21.3 kg/m.   General appearance:  Normal Thyroid:  Symmetrical, normal in size, without palpable masses or nodularity. Respiratory  Auscultation:  Clear without wheezing or rhonchi Cardiovascular  Auscultation:  Regular rate, without rubs, murmurs or gallops  Edema/varicosities:  Not grossly evident Abdominal  Soft,nontender, without masses, guarding or rebound.  Liver/spleen:  No organomegaly noted  Hernia:  None appreciated  Skin  Inspection:  Grossly normal   Breasts: Examined lying and sitting. Implants    Right: Without masses, retractions, discharge or axillary adenopathy.     Left: Without masses, retractions, discharge or axillary adenopathy. Gentitourinary   Inguinal/mons:  Normal without inguinal adenopathy  External genitalia:  Normal  BUS/Urethra/Skene's glands:  Normal  Vagina:  Normal  Cervix:  Normal  Uterus: normal in size, shape and contour.  Midline and mobile  Adnexa/parametria:     Rt: Without masses or  tenderness.   Lt: Without masses or tenderness.  Anus and perineum: Normal    Assessment/Plan:  46 y.o. MWF G2P2 +1 stepdaughter for annual exam with occasional menopausal symptoms.    Perimenopausal/2013 her option/vasectomy Hypertension/hyperthyroidism/ADD-PCP manages labs and meds  Plan: SBE's, continue annual screening mammogram 3-D tomography reviewed and encouraged. Continue healthy exercise and diet routine, reviewed BMI is good at 21. Mammogram scheduled for 06/2017. Discontinue Estroven, encouraged to  try vitamin E 2 tablets daily for vasomotor symptoms. Follow-up in 1 month if no improvement. Pap normal 2017, new screening guidelines reviewed.    [place], 10:56 AM 01/25/2017

## 2017-01-25 NOTE — Patient Instructions (Signed)

## 2017-02-19 ENCOUNTER — Ambulatory Visit
Admission: RE | Admit: 2017-02-19 | Discharge: 2017-02-19 | Disposition: A | Discharge status: Home / Self Care | Payer: BC Managed Care – PPO | Attending: Adult Health | Admitting: Adult Health

## 2017-02-19 DIAGNOSIS — E039 Hypothyroidism, unspecified: Secondary | ICD-10-CM | POA: Diagnosis not present

## 2017-02-19 DIAGNOSIS — G4700 Insomnia, unspecified: Secondary | ICD-10-CM | POA: Diagnosis not present

## 2017-02-19 DIAGNOSIS — I10 Essential (primary) hypertension: Secondary | ICD-10-CM | POA: Diagnosis not present

## 2017-02-19 MED ORDER — FLUTICASONE PROPIONATE 50 MCG/ACTUATION NASAL SPRAY,SUSPENSION
Freq: Every day | NASAL | 3 refills | 0.00000 days | Status: CP
Start: 2017-02-19 — End: ?

## 2017-02-19 MED ORDER — LEVOTHYROXINE 88 MCG TABLET
ORAL_TABLET | Freq: Every day | ORAL | 1 refills | 0 days | Status: CP
Start: 2017-02-19 — End: ?

## 2017-02-19 MED ORDER — LOSARTAN 100 MG-HYDROCHLOROTHIAZIDE 12.5 MG TABLET
ORAL_TABLET | Freq: Every day | ORAL | 1 refills | 0.00000 days | Status: CP
Start: 2017-02-19 — End: 2017-03-26

## 2017-02-19 MED ORDER — ZOLPIDEM 5 MG TABLET
ORAL_TABLET | Freq: Every evening | ORAL | 1 refills | 0.00000 days | Status: CP | PRN
Start: 2017-02-19 — End: ?

## 2017-03-26 MED ORDER — LOSARTAN 100 MG-HYDROCHLOROTHIAZIDE 12.5 MG TABLET
ORAL_TABLET | Freq: Every day | ORAL | 1 refills | 0.00000 days | Status: CP
Start: 2017-03-26 — End: 2018-03-26

## 2017-03-31 DIAGNOSIS — Z79899 Other long term (current) drug therapy: Secondary | ICD-10-CM | POA: Diagnosis not present

## 2017-03-31 DIAGNOSIS — F902 Attention-deficit hyperactivity disorder, combined type: Secondary | ICD-10-CM | POA: Diagnosis not present

## 2017-04-20 DIAGNOSIS — M25532 Pain in left wrist: Secondary | ICD-10-CM | POA: Diagnosis not present

## 2017-04-20 DIAGNOSIS — S6982XA Other specified injuries of left wrist, hand and finger(s), initial encounter: Secondary | ICD-10-CM | POA: Diagnosis not present

## 2017-04-30 DIAGNOSIS — M24232 Disorder of ligament, left wrist: Secondary | ICD-10-CM | POA: Diagnosis not present

## 2017-04-30 DIAGNOSIS — M65832 Other synovitis and tenosynovitis, left forearm: Secondary | ICD-10-CM | POA: Diagnosis not present

## 2017-05-03 ENCOUNTER — Other Ambulatory Visit: Payer: Self-pay | Admitting: Nurse Practitioner

## 2017-05-04 DIAGNOSIS — S6982XA Other specified injuries of left wrist, hand and finger(s), initial encounter: Secondary | ICD-10-CM | POA: Diagnosis not present

## 2017-05-04 DIAGNOSIS — S6992XA Unspecified injury of left wrist, hand and finger(s), initial encounter: Secondary | ICD-10-CM | POA: Diagnosis not present

## 2017-05-12 DIAGNOSIS — M25532 Pain in left wrist: Secondary | ICD-10-CM | POA: Diagnosis not present

## 2017-05-14 DIAGNOSIS — M25532 Pain in left wrist: Secondary | ICD-10-CM | POA: Diagnosis not present

## 2017-05-14 DIAGNOSIS — S638X2D Sprain of other part of left wrist and hand, subsequent encounter: Secondary | ICD-10-CM | POA: Diagnosis not present

## 2017-05-24 ENCOUNTER — Other Ambulatory Visit: Payer: Self-pay | Admitting: Women's Health

## 2017-05-24 DIAGNOSIS — Z1231 Encounter for screening mammogram for malignant neoplasm of breast: Secondary | ICD-10-CM

## 2017-05-26 DIAGNOSIS — S638X2D Sprain of other part of left wrist and hand, subsequent encounter: Secondary | ICD-10-CM | POA: Diagnosis not present

## 2017-05-26 DIAGNOSIS — M25532 Pain in left wrist: Secondary | ICD-10-CM | POA: Diagnosis not present

## 2017-06-07 DIAGNOSIS — S6982XD Other specified injuries of left wrist, hand and finger(s), subsequent encounter: Secondary | ICD-10-CM | POA: Diagnosis not present

## 2017-06-07 DIAGNOSIS — M25532 Pain in left wrist: Secondary | ICD-10-CM | POA: Diagnosis not present

## 2017-07-05 DIAGNOSIS — S6982XD Other specified injuries of left wrist, hand and finger(s), subsequent encounter: Secondary | ICD-10-CM | POA: Diagnosis not present

## 2017-07-13 DIAGNOSIS — I10 Essential (primary) hypertension: Secondary | ICD-10-CM | POA: Diagnosis not present

## 2017-07-13 DIAGNOSIS — F902 Attention-deficit hyperactivity disorder, combined type: Secondary | ICD-10-CM | POA: Diagnosis not present

## 2017-07-13 DIAGNOSIS — Z79899 Other long term (current) drug therapy: Secondary | ICD-10-CM | POA: Diagnosis not present

## 2017-08-24 DIAGNOSIS — Z832 Family history of diseases of the blood and blood-forming organs and certain disorders involving the immune mechanism: Secondary | ICD-10-CM | POA: Diagnosis not present

## 2017-08-24 DIAGNOSIS — I10 Essential (primary) hypertension: Secondary | ICD-10-CM | POA: Diagnosis not present

## 2017-08-24 DIAGNOSIS — E038 Other specified hypothyroidism: Secondary | ICD-10-CM | POA: Diagnosis not present

## 2017-08-24 DIAGNOSIS — N951 Menopausal and female climacteric states: Secondary | ICD-10-CM | POA: Diagnosis not present

## 2017-08-30 ENCOUNTER — Ambulatory Visit
Admission: RE | Admit: 2017-08-30 | Discharge: 2017-08-30 | Disposition: A | Discharge status: Home / Self Care | Payer: BLUE CROSS/BLUE SHIELD | Source: Ambulatory Visit | Attending: Women's Health | Admitting: Women's Health

## 2017-08-30 DIAGNOSIS — Z1231 Encounter for screening mammogram for malignant neoplasm of breast: Secondary | ICD-10-CM | POA: Diagnosis not present

## 2017-09-07 ENCOUNTER — Encounter: Payer: Self-pay | Admitting: Women's Health

## 2017-09-07 ENCOUNTER — Ambulatory Visit (INDEPENDENT_AMBULATORY_CARE_PROVIDER_SITE_OTHER): Payer: BLUE CROSS/BLUE SHIELD | Admitting: Women's Health

## 2017-09-07 VITALS — BP 128/80

## 2017-09-07 DIAGNOSIS — Z79890 Hormone replacement therapy (postmenopausal): Secondary | ICD-10-CM | POA: Diagnosis not present

## 2017-09-07 MED ORDER — ESTRADIOL 0.05 MG/24HR TD PTTW: 1.0000 | MEDICATED_PATCH | TRANSDERMAL | 3 refills | Status: DC

## 2017-09-07 MED ORDER — PROGESTERONE MICRONIZED 100 MG PO CAPS: ORAL_CAPSULE | ORAL | 4 refills | Status: DC

## 2017-09-07 NOTE — Patient Instructions (Signed)

## 2017-09-07 NOTE — Progress Notes (Signed)
47 yo MWF G2P2 presents with complaint of "menopausal symptoms." Complains of hot flashes, irritability, mood changes and intermittent sleep disturbances. Elevated FSH at primary care. Negative for Factor V Leiden Disorder, Mother and Sister are positive. Hx of uterine ablation. 01/25/17, amenorrheic. Tried, discontinued estroven because menopause symptoms ongoing, started Vit E, symptoms still ongoing. 3 teenage children in the home/situational stress.  Hypothyroid, hypertension primary care manages.  Exam: Appears well, able to articulate symptoms well. Has maintained 30lb weight loss for 2 years with diet and exercise.  Post menopausal symptoms  Plan: Discussed options for HRT, prescribed Vivelle 0.05mg  patch, twice weekly proper administration reviewed, Prometrium 100mg  capsule PO QD at bedtime. Counseled on risks including blood clots and stroke, and breast cancer. Advised to follow up in 2 weeks if symptoms do not improve.

## 2017-10-05 DIAGNOSIS — Z79899 Other long term (current) drug therapy: Secondary | ICD-10-CM | POA: Diagnosis not present

## 2017-10-05 DIAGNOSIS — F902 Attention-deficit hyperactivity disorder, combined type: Secondary | ICD-10-CM | POA: Diagnosis not present

## 2018-01-11 DIAGNOSIS — I10 Essential (primary) hypertension: Secondary | ICD-10-CM | POA: Diagnosis not present

## 2018-01-11 DIAGNOSIS — Z79899 Other long term (current) drug therapy: Secondary | ICD-10-CM | POA: Diagnosis not present

## 2018-01-11 DIAGNOSIS — F902 Attention-deficit hyperactivity disorder, combined type: Secondary | ICD-10-CM | POA: Diagnosis not present

## 2018-02-02 ENCOUNTER — Encounter: Payer: Self-pay | Admitting: Women's Health

## 2018-02-02 ENCOUNTER — Ambulatory Visit (INDEPENDENT_AMBULATORY_CARE_PROVIDER_SITE_OTHER): Payer: BLUE CROSS/BLUE SHIELD | Admitting: Women's Health

## 2018-02-02 VITALS — BP 122/80 | Ht 65.0 in | Wt 146.0 lb

## 2018-02-02 DIAGNOSIS — N898 Other specified noninflammatory disorders of vagina: Secondary | ICD-10-CM

## 2018-02-02 DIAGNOSIS — Z79890 Hormone replacement therapy (postmenopausal): Secondary | ICD-10-CM

## 2018-02-02 DIAGNOSIS — N760 Acute vaginitis: Secondary | ICD-10-CM | POA: Diagnosis not present

## 2018-02-02 DIAGNOSIS — B9689 Other specified bacterial agents as the cause of diseases classified elsewhere: Secondary | ICD-10-CM

## 2018-02-02 DIAGNOSIS — Z01419 Encounter for gynecological examination (general) (routine) without abnormal findings: Secondary | ICD-10-CM | POA: Diagnosis not present

## 2018-02-02 LAB — WET PREP FOR TRICH, YEAST, CLUE

## 2018-02-02 MED ORDER — METRONIDAZOLE 500 MG PO TABS: 500.0000 mg | ORAL_TABLET | Freq: Two times a day (BID) | ORAL | 0 refills | Status: DC

## 2018-02-02 MED ORDER — PROGESTERONE MICRONIZED 100 MG PO CAPS: ORAL_CAPSULE | ORAL | 4 refills | Status: DC

## 2018-02-02 MED ORDER — ESTRADIOL 0.0375 MG/24HR TD PTTW: 1.0000 | MEDICATED_PATCH | TRANSDERMAL | 4 refills | Status: DC

## 2018-02-02 NOTE — Progress Notes (Signed)
Bethany Mata 23-Jun-1971 [id number]    History:    Presents for annual exam.  Postmenopausal on HRT with no bleeding.  States does feel bloated at times which dissipates when off HRT but feels better on HRT than off.    01/2012 endometrial ablation.  Normal Pap and mammogram history.  Primary care manages hypothyroidism and hypertension.  Past medical history, past surgical history, family history and social history were all reviewed and documented in the EPIC chart.  Son 12 living independently, stepdaughter 20 [name] now living with her mother for the summer, daughter 41 working doing well.  Strained relationship with 45 year old son and stepdaughter [name].  Parents hypertension  ROS:  A ROS was performed and pertinent positives and negatives are included.  Exam:  Vitals:   02/02/18 1014  BP: 122/80  Weight: 146 lb (66.2 kg)  Height: 5\' 5"  (1.651 m)   Body mass index is 24.3 kg/m.   General appearance:  Normal Thyroid:  Symmetrical, normal in size, without palpable masses or nodularity. Respiratory  Auscultation:  Clear without wheezing or rhonchi Cardiovascular  Auscultation:  Regular rate, without rubs, murmurs or gallops  Edema/varicosities:  Not grossly evident Abdominal  Soft,nontender, without masses, guarding or rebound.  Liver/spleen:  No organomegaly noted  Hernia:  None appreciated  Skin  Inspection:  Grossly normal   Breasts: Examined lying and sitting. Bilateral implants    Right: Without masses, retractions, discharge or axillary adenopathy.     Left: Without masses, retractions, discharge or axillary adenopathy. Gentitourinary   Inguinal/mons:  Normal without inguinal adenopathy  External genitalia:  Normal  BUS/Urethra/Skene's glands:  Normal  Vagina: Moderate white discharge with odor, wet prep positive for clues, TNTC bacteria  Cervix:  Normal  Uterus:   normal in size, shape and contour.  Midline and mobile  Adnexa/parametria:     Rt: Without masses or  tenderness.   Lt: Without masses or tenderness.  Anus and perineum: Normal  Digital rectal exam: Normal sphincter tone without palpated masses or tenderness  Assessment/Plan:  47 y.o. MWF G2, P2 for annual exam with occasional bloating.  Postmenopausal on HRT Bacterial vaginosis Hypertension, hypothyroidism-primary care manages labs and meds  Plan: HRT reviewed slight risk of blood clots strokes and breast cancer, will try Vivelle 0.0375 patch twice weekly to see if less bloating sensation with change from 0.5.  States when off HRT minimal bloating but does not feel well.  Continue Prometrium 100 mg at bedtime.  Flagyl 500 mg p.o. twice daily for 7 days, alcohol precautions reviewed.  Instructed to call if continued discharge.  SBE's, continue annual screening mammogram, calcium rich foods, vitamin D 1000 daily encouraged.  Continue healthy lifestyle of exercise, works with a Clinical research associate.  Pap normal  2017, new screening guidelines reviewed.    [facility], 11:31 AM 02/02/2018

## 2018-02-02 NOTE — Patient Instructions (Addendum)
Health Maintenance for Postmenopausal Women Menopause is a normal process in which your reproductive ability comes to an end. This process happens gradually over a span of months to years, usually between the ages of 22 and 9. Menopause is complete when you have missed 12 consecutive menstrual periods. It is important to talk with your health care provider about some of the most common conditions that affect postmenopausal women, such as heart disease, cancer, and bone loss (osteoporosis). Adopting a healthy lifestyle and getting preventive care can help to promote your health and wellness. Those actions can also lower your chances of developing some of these common conditions. What should I know about menopause? During menopause, you may experience a number of symptoms, such as:  Moderate-to-severe hot flashes.  Night sweats.  Decrease in sex drive.  Mood swings.  Headaches.  Tiredness.  Irritability.  Memory problems.  Insomnia.  Choosing to treat or not to treat menopausal changes is an individual decision that you make with your health care provider. What should I know about hormone replacement therapy and supplements? Hormone therapy products are effective for treating symptoms that are associated with menopause, such as hot flashes and night sweats. Hormone replacement carries certain risks, especially as you become older. If you are thinking about using estrogen or estrogen with progestin treatments, discuss the benefits and risks with your health care provider. What should I know about heart disease and stroke? Heart disease, heart attack, and stroke become more likely as you age. This may be due, in part, to the hormonal changes that your body experiences during menopause. These can affect how your body processes dietary fats, triglycerides, and cholesterol. Heart attack and stroke are both medical emergencies. There are many things that you can do to help prevent heart disease  and stroke:  Have your blood pressure checked at least every 1-2 years. High blood pressure causes heart disease and increases the risk of stroke.  If you are 53-22 years old, ask your health care provider if you should take aspirin to prevent a heart attack or a stroke.  Do not use any tobacco products, including cigarettes, chewing tobacco, or electronic cigarettes. If you need help quitting, ask your health care provider.  It is important to eat a healthy diet and maintain a healthy weight. ? Be sure to include plenty of vegetables, fruits, low-fat dairy products, and lean protein. ? Avoid eating foods that are high in solid fats, added sugars, or salt (sodium).  Get regular exercise. This is one of the most important things that you can do for your health. ? Try to exercise for at least 150 minutes each week. The type of exercise that you do should increase your heart rate and make you sweat. This is known as moderate-intensity exercise. ? Try to do strengthening exercises at least twice each week. Do these in addition to the moderate-intensity exercise.  Know your numbers.Ask your health care provider to check your cholesterol and your blood glucose. Continue to have your blood tested as directed by your health care provider.  What should I know about cancer screening? There are several types of cancer. Take the following steps to reduce your risk and to catch any cancer development as early as possible. Breast Cancer  Practice breast self-awareness. ? This means understanding how your breasts normally appear and feel. ? It also means doing regular breast self-exams. Let your health care provider know about any changes, no matter how small.  If you are 40  or older, have a clinician do a breast exam (clinical breast exam or CBE) every year. Depending on your age, family history, and medical history, it may be recommended that you also have a yearly breast X-ray (mammogram).  If you  have a family history of breast cancer, talk with your health care provider about genetic screening.  If you are at high risk for breast cancer, talk with your health care provider about having an MRI and a mammogram every year.  Breast cancer (BRCA) gene test is recommended for women who have family members with BRCA-related cancers. Results of the assessment will determine the need for genetic counseling and BRCA1 and for BRCA2 testing. BRCA-related cancers include these types: ? Breast. This occurs in males or females. ? Ovarian. ? Tubal. This may also be called fallopian tube cancer. ? Cancer of the abdominal or pelvic lining (peritoneal cancer). ? Prostate. ? Pancreatic.  Cervical, Uterine, and Ovarian Cancer Your health care provider may recommend that you be screened regularly for cancer of the pelvic organs. These include your ovaries, uterus, and vagina. This screening involves a pelvic exam, which includes checking for microscopic changes to the surface of your cervix (Pap test).  For women ages 21-65, health care providers may recommend a pelvic exam and a Pap test every three years. For women ages 79-65, they may recommend the Pap test and pelvic exam, combined with testing for human papilloma virus (HPV), every five years. Some types of HPV increase your risk of cervical cancer. Testing for HPV may also be done on women of any age who have unclear Pap test results.  Other health care providers may not recommend any screening for nonpregnant women who are considered low risk for pelvic cancer and have no symptoms. Ask your health care provider if a screening pelvic exam is right for you.  If you have had past treatment for cervical cancer or a condition that could lead to cancer, you need Pap tests and screening for cancer for at least 20 years after your treatment. If Pap tests have been discontinued for you, your risk factors (such as having a new sexual partner) need to be  reassessed to determine if you should start having screenings again. Some women have medical problems that increase the chance of getting cervical cancer. In these cases, your health care provider may recommend that you have screening and Pap tests more often.  If you have a family history of uterine cancer or ovarian cancer, talk with your health care provider about genetic screening.  If you have vaginal bleeding after reaching menopause, tell your health care provider.  There are currently no reliable tests available to screen for ovarian cancer.  Lung Cancer Lung cancer screening is recommended for adults 69-62 years old who are at high risk for lung cancer because of a history of smoking. A yearly low-dose CT scan of the lungs is recommended if you:  Currently smoke.  Have a history of at least 30 pack-years of smoking and you currently smoke or have quit within the past 15 years. A pack-year is smoking an average of one pack of cigarettes per day for one year.  Yearly screening should:  Continue until it has been 15 years since you quit.  Stop if you develop a health problem that would prevent you from having lung cancer treatment.  Colorectal Cancer  This type of cancer can be detected and can often be prevented.  Routine colorectal cancer screening usually begins at  age 42 and continues through age 45.  If you have risk factors for colon cancer, your health care provider may recommend that you be screened at an earlier age.  If you have a family history of colorectal cancer, talk with your health care provider about genetic screening.  Your health care provider may also recommend using home test kits to check for hidden blood in your stool.  A small camera at the end of a tube can be used to examine your colon directly (sigmoidoscopy or colonoscopy). This is done to check for the earliest forms of colorectal cancer.  Direct examination of the colon should be repeated every  5-10 years until age 71. However, if early forms of precancerous polyps or small growths are found or if you have a family history or genetic risk for colorectal cancer, you may need to be screened more often.  Skin Cancer  Check your skin from head to toe regularly.  Monitor any moles. Be sure to tell your health care provider: ? About any new moles or changes in moles, especially if there is a change in a mole's shape or color. ? If you have a mole that is larger than the size of a pencil eraser.  If any of your family members has a history of skin cancer, especially at a Francia Verry age, talk with your health care provider about genetic screening.  Always use sunscreen. Apply sunscreen liberally and repeatedly throughout the day.  Whenever you are outside, protect yourself by wearing long sleeves, pants, a wide-brimmed hat, and sunglasses.  What should I know about osteoporosis? Osteoporosis is a condition in which bone destruction happens more quickly than new bone creation. After menopause, you may be at an increased risk for osteoporosis. To help prevent osteoporosis or the bone fractures that can happen because of osteoporosis, the following is recommended:  If you are 46-71 years old, get at least 1,000 mg of calcium and at least 600 mg of vitamin D per day.  If you are older than age 55 but younger than age 65, get at least 1,200 mg of calcium and at least 600 mg of vitamin D per day.  If you are older than age 54, get at least 1,200 mg of calcium and at least 800 mg of vitamin D per day.  Smoking and excessive alcohol intake increase the risk of osteoporosis. Eat foods that are rich in calcium and vitamin D, and do weight-bearing exercises several times each week as directed by your health care provider. What should I know about how menopause affects my mental health? Depression may occur at any age, but it is more common as you become older. Common symptoms of depression  include:  Low or sad mood.  Changes in sleep patterns.  Changes in appetite or eating patterns.  Feeling an overall lack of motivation or enjoyment of activities that you previously enjoyed.  Frequent crying spells.  Talk with your health care provider if you think that you are experiencing depression. What should I know about immunizations? It is important that you get and maintain your immunizations. These include:  Tetanus, diphtheria, and pertussis (Tdap) booster vaccine.  Influenza every year before the flu season begins.  Pneumonia vaccine.  Shingles vaccine.  Your health care provider may also recommend other immunizations. This information is not intended to replace advice given to you by your health care provider. Make sure you discuss any questions you have with your health care provider. Document Released: 09/25/2005  Document Revised: 02/21/2016 Document Reviewed: 05/07/2015 Elsevier Interactive Patient Education  2018 [employer]  Bacterial Vaginosis Bacterial vaginosis is an infection of the vagina. It happens when too many germs (bacteria) grow in the vagina. This infection puts you at risk for infections from sex (STIs). Treating this infection can lower your risk for some STIs. You should also treat this if you are pregnant. It can cause your baby to be born early. Follow these instructions at home: Medicines  Take over-the-counter and prescription medicines only as told by your doctor.  Take or use your antibiotic medicine as told by your doctor. Do not stop taking or using it even if you start to feel better. General instructions  If you your sexual partner is a woman, tell her that you have this infection. She needs to get treatment if she has symptoms. If you have a female partner, he does not need to be treated.  During treatment: ? Avoid sex. ? Do not douche. ? Avoid alcohol as told. ? Avoid breastfeeding as told.  Drink enough fluid to keep your  pee (urine) clear or pale yellow.  Keep your vagina and butt (rectum) clean. ? Wash the area with warm water every day. ? Wipe from front to back after you use the toilet.  Keep all follow-up visits as told by your doctor. This is important. Preventing this condition  Do not douche.  Use only warm water to wash around your vagina.  Use protection when you have sex. This includes: ? Latex condoms. ? Dental dams.  Limit how many people you have sex with. It is best to only have sex with the same person (be monogamous).  Get tested for STIs. Have your partner get tested.  Wear underwear that is cotton or lined with cotton.  Avoid tight pants and pantyhose. This is most important in summer.  Do not use any products that have nicotine or tobacco in them. These include cigarettes and e-cigarettes. If you need help quitting, ask your doctor.  Do not use illegal drugs.  Limit how much alcohol you drink. Contact a doctor if:  Your symptoms do not get better, even after you are treated.  You have more discharge or pain when you pee (urinate).  You have a fever.  You have pain in your belly (abdomen).  You have pain with sex.  Your bleed from your vagina between periods. Summary  This infection happens when too many germs (bacteria) grow in the vagina.  Treating this condition can lower your risk for some infections from sex (STIs).  You should also treat this if you are pregnant. It can cause early (premature) birth.  Do not stop taking or using your antibiotic medicine even if you start to feel better. This information is not intended to replace advice given to you by your health care provider. Make sure you discuss any questions you have with your health care provider. Document Released: 05/12/2008 Document Revised: 04/18/2016 Document Reviewed: 04/18/2016 Elsevier Interactive Patient Education  2017 Elsevier Inc.  

## 2018-04-22 ENCOUNTER — Other Ambulatory Visit: Payer: Self-pay | Admitting: Women's Health

## 2018-04-22 DIAGNOSIS — Z1231 Encounter for screening mammogram for malignant neoplasm of breast: Secondary | ICD-10-CM

## 2018-04-25 DIAGNOSIS — F902 Attention-deficit hyperactivity disorder, combined type: Secondary | ICD-10-CM | POA: Diagnosis not present

## 2018-04-25 DIAGNOSIS — Z79899 Other long term (current) drug therapy: Secondary | ICD-10-CM | POA: Diagnosis not present

## 2018-05-31 DIAGNOSIS — J019 Acute sinusitis, unspecified: Secondary | ICD-10-CM | POA: Diagnosis not present

## 2018-05-31 DIAGNOSIS — K1370 Unspecified lesions of oral mucosa: Secondary | ICD-10-CM | POA: Diagnosis not present

## 2018-05-31 DIAGNOSIS — Z23 Encounter for immunization: Secondary | ICD-10-CM | POA: Diagnosis not present

## 2018-08-16 ENCOUNTER — Telehealth: Payer: Self-pay | Admitting: *Deleted

## 2018-08-16 DIAGNOSIS — Z79890 Hormone replacement therapy (postmenopausal): Secondary | ICD-10-CM

## 2018-08-16 MED ORDER — PROGESTERONE MICRONIZED 100 MG PO CAPS: ORAL_CAPSULE | ORAL | 1 refills | Status: DC

## 2018-08-16 MED ORDER — ESTRADIOL 0.0375 MG/24HR TD PTTW: 1.0000 | MEDICATED_PATCH | TRANSDERMAL | 1 refills | Status: DC

## 2018-08-16 NOTE — Telephone Encounter (Signed)
Patient called requesting HRT vivelle dot patch 0.0375 mg and progesterone 100 cap sent to [facility] order. Rx sent.

## 2018-08-22 DIAGNOSIS — Z79899 Other long term (current) drug therapy: Secondary | ICD-10-CM | POA: Diagnosis not present

## 2018-08-22 DIAGNOSIS — E039 Hypothyroidism, unspecified: Secondary | ICD-10-CM | POA: Diagnosis not present

## 2018-08-22 DIAGNOSIS — F902 Attention-deficit hyperactivity disorder, combined type: Secondary | ICD-10-CM | POA: Diagnosis not present

## 2018-08-22 DIAGNOSIS — I10 Essential (primary) hypertension: Secondary | ICD-10-CM | POA: Diagnosis not present

## 2018-09-01 ENCOUNTER — Ambulatory Visit
Admission: RE | Admit: 2018-09-01 | Discharge: 2018-09-01 | Disposition: A | Discharge status: Home / Self Care | Payer: BLUE CROSS/BLUE SHIELD | Source: Ambulatory Visit | Attending: Women's Health | Admitting: Women's Health

## 2018-09-01 DIAGNOSIS — Z1231 Encounter for screening mammogram for malignant neoplasm of breast: Secondary | ICD-10-CM | POA: Diagnosis not present

## 2018-10-27 DIAGNOSIS — J208 Acute bronchitis due to other specified organisms: Secondary | ICD-10-CM | POA: Diagnosis not present

## 2018-11-11 DIAGNOSIS — F902 Attention-deficit hyperactivity disorder, combined type: Secondary | ICD-10-CM | POA: Diagnosis not present

## 2018-11-11 DIAGNOSIS — Z79899 Other long term (current) drug therapy: Secondary | ICD-10-CM | POA: Diagnosis not present

## 2019-02-01 ENCOUNTER — Encounter: Payer: BLUE CROSS/BLUE SHIELD | Admitting: Women's Health

## 2019-02-02 ENCOUNTER — Other Ambulatory Visit: Payer: Self-pay

## 2019-02-02 DIAGNOSIS — Z79890 Hormone replacement therapy (postmenopausal): Secondary | ICD-10-CM

## 2019-02-02 MED ORDER — ESTRADIOL 0.0375 MG/24HR TD PTTW: 1.0000 | MEDICATED_PATCH | TRANSDERMAL | 0 refills | Status: DC

## 2019-02-02 MED ORDER — PROGESTERONE MICRONIZED 100 MG PO CAPS: ORAL_CAPSULE | ORAL | 0 refills | Status: DC

## 2019-03-10 DIAGNOSIS — Z79899 Other long term (current) drug therapy: Secondary | ICD-10-CM | POA: Diagnosis not present

## 2019-03-10 DIAGNOSIS — F902 Attention-deficit hyperactivity disorder, combined type: Secondary | ICD-10-CM | POA: Diagnosis not present

## 2019-05-18 ENCOUNTER — Other Ambulatory Visit: Payer: Self-pay | Admitting: Women's Health

## 2019-05-18 DIAGNOSIS — Z79890 Hormone replacement therapy (postmenopausal): Secondary | ICD-10-CM

## 2019-05-18 NOTE — Telephone Encounter (Signed)
Bethany Mata to call patient to schedule CE. She was due this past June.

## 2019-05-18 NOTE — Telephone Encounter (Signed)
Claudia left message for patient to call to scheduled CE.

## 2019-05-23 DIAGNOSIS — F902 Attention-deficit hyperactivity disorder, combined type: Secondary | ICD-10-CM | POA: Diagnosis not present

## 2019-05-23 DIAGNOSIS — Z79899 Other long term (current) drug therapy: Secondary | ICD-10-CM | POA: Diagnosis not present

## 2019-06-13 ENCOUNTER — Other Ambulatory Visit: Payer: Self-pay

## 2019-06-14 ENCOUNTER — Encounter: Payer: Self-pay | Admitting: Women's Health

## 2019-06-14 ENCOUNTER — Ambulatory Visit (INDEPENDENT_AMBULATORY_CARE_PROVIDER_SITE_OTHER): Payer: BC Managed Care – PPO | Admitting: Women's Health

## 2019-06-14 VITALS — BP 122/78 | Ht 65.0 in | Wt 146.0 lb

## 2019-06-14 DIAGNOSIS — Z79890 Hormone replacement therapy (postmenopausal): Secondary | ICD-10-CM

## 2019-06-14 DIAGNOSIS — E7801 Familial hypercholesterolemia: Secondary | ICD-10-CM | POA: Diagnosis not present

## 2019-06-14 DIAGNOSIS — Z01419 Encounter for gynecological examination (general) (routine) without abnormal findings: Secondary | ICD-10-CM

## 2019-06-14 DIAGNOSIS — R87610 Atypical squamous cells of undetermined significance on cytologic smear of cervix (ASC-US): Secondary | ICD-10-CM | POA: Diagnosis not present

## 2019-06-14 MED ORDER — PROGESTERONE MICRONIZED 100 MG PO CAPS: ORAL_CAPSULE | ORAL | 4 refills | Status: DC

## 2019-06-14 MED ORDER — ESTRADIOL 0.0375 MG/24HR TD PTTW: 1.0000 | MEDICATED_PATCH | TRANSDERMAL | 4 refills | Status: DC

## 2019-06-14 NOTE — Patient Instructions (Signed)
[facility]  [id number]  Dr Carlean Purl  Vit D3 2000 iu daily  Health Maintenance for Postmenopausal Women Menopause is a normal process in which your ability to get pregnant comes to an end. This process happens slowly over many months or years, usually between the ages of 38 and 38. Menopause is complete when you have missed your menstrual periods for 12 months. It is important to talk with your health care provider about some of the most common conditions that affect women after menopause (postmenopausal women). These include heart disease, cancer, and bone loss (osteoporosis). Adopting a healthy lifestyle and getting preventive care can help to promote your health and wellness. The actions you take can also lower your chances of developing some of these common conditions. What should I know about menopause? During menopause, you may get a number of symptoms, such as:  Hot flashes. These can be moderate or severe.  Night sweats.  Decrease in sex drive.  Mood swings.  Headaches.  Tiredness.  Irritability.  Memory problems.  Insomnia. Choosing to treat or not to treat these symptoms is a decision that you make with your health care provider. Do I need hormone replacement therapy?  Hormone replacement therapy is effective in treating symptoms that are caused by menopause, such as hot flashes and night sweats.  Hormone replacement carries certain risks, especially as you become older. If you are thinking about using estrogen or estrogen with progestin, discuss the benefits and risks with your health care provider. What is my risk for heart disease and stroke? The risk of heart disease, heart attack, and stroke increases as you age. One of the causes may be a change in the body's hormones during menopause. This can affect how your body uses dietary fats, triglycerides, and cholesterol. Heart attack and stroke are medical emergencies. There are many things that you can do to help  prevent heart disease and stroke. Watch your blood pressure  High blood pressure causes heart disease and increases the risk of stroke. This is more likely to develop in people who have high blood pressure readings, are of African descent, or are overweight.  Have your blood pressure checked: ? Every 3-5 years if you are 4-49 years of age. ? Every year if you are 52 years old or older. Eat a healthy diet   Eat a diet that includes plenty of vegetables, fruits, low-fat dairy products, and lean protein.  Do not eat a lot of foods that are high in solid fats, added sugars, or sodium. Get regular exercise Get regular exercise. This is one of the most important things you can do for your health. Most adults should:  Try to exercise for at least 150 minutes each week. The exercise should increase your heart rate and make you sweat (moderate-intensity exercise).  Try to do strengthening exercises at least twice each week. Do these in addition to the moderate-intensity exercise.  Spend less time sitting. Even light physical activity can be beneficial. Other tips  Work with your health care provider to achieve or maintain a healthy weight.  Do not use any products that contain nicotine or tobacco, such as cigarettes, e-cigarettes, and chewing tobacco. If you need help quitting, ask your health care provider.  Know your numbers. Ask your health care provider to check your cholesterol and your blood sugar (glucose). Continue to have your blood tested as directed by your health care provider. Do I need screening for cancer? Depending on your health history and family  history, you may need to have cancer screening at different stages of your life. This may include screening for:  Breast cancer.  Cervical cancer.  Lung cancer.  Colorectal cancer. What is my risk for osteoporosis? After menopause, you may be at increased risk for osteoporosis. Osteoporosis is a condition in which bone  destruction happens more quickly than new bone creation. To help prevent osteoporosis or the bone fractures that can happen because of osteoporosis, you may take the following actions:  If you are 68-62 years old, get at least 1,000 mg of calcium and at least 600 mg of vitamin D per day.  If you are older than age 72 but younger than age 3, get at least 1,200 mg of calcium and at least 600 mg of vitamin D per day.  If you are older than age 76, get at least 1,200 mg of calcium and at least 800 mg of vitamin D per day. Smoking and drinking excessive alcohol increase the risk of osteoporosis. Eat foods that are rich in calcium and vitamin D, and do weight-bearing exercises several times each week as directed by your health care provider. How does menopause affect my mental health? Depression may occur at any age, but it is more common as you become older. Common symptoms of depression include:  Low or sad mood.  Changes in sleep patterns.  Changes in appetite or eating patterns.  Feeling an overall lack of motivation or enjoyment of activities that you previously enjoyed.  Frequent crying spells. Talk with your health care provider if you think that you are experiencing depression. General instructions See your health care provider for regular wellness exams and vaccines. This may include:  Scheduling regular health, dental, and eye exams.  Getting and maintaining your vaccines. These include: ? Influenza vaccine. Get this vaccine each year before the flu season begins. ? Pneumonia vaccine. ? Shingles vaccine. ? Tetanus, diphtheria, and pertussis (Tdap) booster vaccine. Your health care provider may also recommend other immunizations. Tell your health care provider if you have ever been abused or do not feel safe at home. Summary  Menopause is a normal process in which your ability to get pregnant comes to an end.  This condition causes hot flashes, night sweats, decreased  interest in sex, mood swings, headaches, or lack of sleep.  Treatment for this condition may include hormone replacement therapy.  Take actions to keep yourself healthy, including exercising regularly, eating a healthy diet, watching your weight, and checking your blood pressure and blood sugar levels.  Get screened for cancer and depression. Make sure that you are up to date with all your vaccines. This information is not intended to replace advice given to you by your health care provider. Make sure you discuss any questions you have with your health care provider. Document Released: 09/25/2005 Document Revised: 07/27/2018 Document Reviewed: 07/27/2018 Elsevier Patient Education  2020 [employer].

## 2019-06-14 NOTE — Progress Notes (Signed)
Bethany Mata [birth year] [id number]    History:    Presents for annual exam.  Postmenopausal on HRT with no bleeding and good relief of menopausal symptoms.  History of abnormal Pap with cryo greater than 10 years ago.  Normal mammogram history.  Bilateral implants.  Primary care manages hypertension,  and hypothyroidism.  Parents have hypertension and hypercholesteremia.  2013 endometrial ablation.  Past medical history, past surgical history, family history and social history were all reviewed and documented in the EPIC chart.  Homemaker, regular daily exercise, works with a Clinical research associate.  Daughter 33 doing well working, son 95 strained relationship, stepdaughter Bethany Mata 92 living with her mother making some bad choices, stepson doing well.  ROS:  A ROS was performed and pertinent positives and negatives are included.  Exam:  Vitals:   06/14/19 1113  BP: 122/78  Weight: 146 lb (66.2 kg)  Height: 5\' 5"  (1.651 m)   Body mass index is 24.3 kg/m.   General appearance:  Normal Thyroid:  Symmetrical, normal in size, without palpable masses or nodularity. Respiratory  Auscultation:  Clear without wheezing or rhonchi Cardiovascular  Auscultation:  Regular rate, without rubs, murmurs or gallops  Edema/varicosities:  Not grossly evident Abdominal  Soft,nontender, without masses, guarding or rebound.  Liver/spleen:  No organomegaly noted  Hernia:  None appreciated  Skin  Inspection:  Grossly normal   Breasts: Examined lying and sitting. Bilateral implants    Right: Without masses, retractions, discharge or axillary adenopathy.     Left: Without masses, retractions, discharge or axillary adenopathy. Gentitourinary   Inguinal/mons:  Normal without inguinal adenopathy  External genitalia:  Normal  BUS/Urethra/Skene's glands:  Normal  Vagina:  Normal  Cervix:  Normal  Uterus:  normal in size, shape and contour.  Midline and mobile  Adnexa/parametria:     Rt: Without masses or  tenderness.   Lt: Without masses or tenderness.  Anus and perineum: Normal  Digital rectal exam: Normal sphincter tone without palpated masses or tenderness  Assessment/Plan:  48 y.o. MWF G3 P2 +2 stepchildren for annual exam with no complaints.  Postmenopausal on HRT with no bleeding Hypertension,  hypothyroidism-primary care manages labs and meds Cryo greater than 10 years ago normal Paps after  Plan: HRT reviewed risks of blood clots, strokes and breast cancer reviewed best to be on less than 7 years women health initiative discussed.  We will continue Vivelle patch 0.0375 twice weekly and Prometrium 100 mg at bedtime daily.  SBEs, continue annual screening mammogram, calcium rich foods, vitamin D 2000 daily encouraged.  Encouraged to continue healthy lifestyle of regular exercise.  Screening colonoscopy reviewed and encouraged at  65.  Pap with HR HPV typing, new screening guidelines reviewed.    Bethany Mata, 11:50 AM 06/14/2019

## 2019-06-15 ENCOUNTER — Telehealth: Payer: Self-pay | Admitting: *Deleted

## 2019-06-15 DIAGNOSIS — Z79890 Hormone replacement therapy (postmenopausal): Secondary | ICD-10-CM

## 2019-06-15 LAB — PAP, TP IMAGING W/ HPV RNA, RFLX HPV TYPE 16,18/45: HPV DNA High Risk: NOT DETECTED

## 2019-06-15 MED ORDER — ESTRADIOL 0.0375 MG/24HR TD PTTW: 1.0000 | MEDICATED_PATCH | TRANSDERMAL | 4 refills | Status: DC

## 2019-06-15 MED ORDER — PROGESTERONE MICRONIZED 100 MG PO CAPS: ORAL_CAPSULE | ORAL | 4 refills | Status: DC

## 2019-06-15 NOTE — Telephone Encounter (Signed)
Per note on 06/14/19 " Vivelle patch 0.0375 twice weekly and Prometrium 100 mg at bedtime daily."   Patient called because Rx's were never sent to pharmacy. Rx sent , patient aware.

## 2019-07-07 DIAGNOSIS — E038 Other specified hypothyroidism: Secondary | ICD-10-CM | POA: Diagnosis not present

## 2019-07-07 DIAGNOSIS — K1370 Unspecified lesions of oral mucosa: Secondary | ICD-10-CM | POA: Diagnosis not present

## 2019-07-07 DIAGNOSIS — Z23 Encounter for immunization: Secondary | ICD-10-CM | POA: Diagnosis not present

## 2019-07-07 DIAGNOSIS — K0510 Chronic gingivitis, plaque induced: Secondary | ICD-10-CM | POA: Diagnosis not present

## 2019-07-07 DIAGNOSIS — I10 Essential (primary) hypertension: Secondary | ICD-10-CM | POA: Diagnosis not present

## 2019-07-07 DIAGNOSIS — Z1159 Encounter for screening for other viral diseases: Secondary | ICD-10-CM | POA: Diagnosis not present

## 2019-07-24 ENCOUNTER — Other Ambulatory Visit: Payer: Self-pay | Admitting: Women's Health

## 2019-07-24 DIAGNOSIS — Z1231 Encounter for screening mammogram for malignant neoplasm of breast: Secondary | ICD-10-CM

## 2019-07-31 DIAGNOSIS — R7989 Other specified abnormal findings of blood chemistry: Secondary | ICD-10-CM | POA: Diagnosis not present

## 2019-08-15 DIAGNOSIS — I10 Essential (primary) hypertension: Secondary | ICD-10-CM | POA: Diagnosis not present

## 2019-08-15 DIAGNOSIS — F902 Attention-deficit hyperactivity disorder, combined type: Secondary | ICD-10-CM | POA: Diagnosis not present

## 2019-08-15 DIAGNOSIS — Z79899 Other long term (current) drug therapy: Secondary | ICD-10-CM | POA: Diagnosis not present

## 2019-09-12 ENCOUNTER — Other Ambulatory Visit: Payer: Self-pay

## 2019-09-12 ENCOUNTER — Ambulatory Visit
Admission: RE | Admit: 2019-09-12 | Discharge: 2019-09-12 | Disposition: A | Discharge status: Home / Self Care | Payer: PRIVATE HEALTH INSURANCE | Source: Ambulatory Visit | Attending: Women's Health | Admitting: Women's Health

## 2019-09-12 DIAGNOSIS — Z1231 Encounter for screening mammogram for malignant neoplasm of breast: Secondary | ICD-10-CM

## 2020-08-22 ENCOUNTER — Other Ambulatory Visit: Payer: Self-pay | Admitting: Nurse Practitioner

## 2020-08-22 DIAGNOSIS — Z1231 Encounter for screening mammogram for malignant neoplasm of breast: Secondary | ICD-10-CM

## 2020-09-05 ENCOUNTER — Encounter: Payer: Self-pay | Admitting: Nurse Practitioner

## 2020-09-05 ENCOUNTER — Other Ambulatory Visit: Payer: Self-pay

## 2020-09-05 ENCOUNTER — Ambulatory Visit: Payer: BC Managed Care – PPO | Admitting: Nurse Practitioner

## 2020-09-05 VITALS — BP 120/86 | HR 80 | Resp 14 | Ht 65.0 in | Wt 158.2 lb

## 2020-09-05 DIAGNOSIS — N3946 Mixed incontinence: Secondary | ICD-10-CM

## 2020-09-05 DIAGNOSIS — Z79890 Hormone replacement therapy (postmenopausal): Secondary | ICD-10-CM | POA: Diagnosis not present

## 2020-09-05 DIAGNOSIS — Z01419 Encounter for gynecological examination (general) (routine) without abnormal findings: Secondary | ICD-10-CM

## 2020-09-05 MED ORDER — ESTRADIOL 0.0375 MG/24HR TD PTTW: 1.0000 | MEDICATED_PATCH | TRANSDERMAL | 3 refills | Status: DC

## 2020-09-05 MED ORDER — PROGESTERONE MICRONIZED 100 MG PO CAPS: 100.0000 mg | ORAL_CAPSULE | Freq: Every day | ORAL | 3 refills | Status: DC

## 2020-09-05 NOTE — Progress Notes (Signed)
Bethany Mata 1970-12-14 [id number]   History:  50 y.o. [id number] presents for annual exam. She complains of mild urinary urgency and incontinence with certain activities that has slowly progressed over the last year. She has had a 12 pound weight gain in 2 years. Postmenopausal on HRT with good relief of hot flashes. Cryo greater than 10 years ago, subsequent paps normal. Normal mammogram history.   Gynecologic History No LMP recorded. Patient has had an ablation.   Contraception/Family planning: post menopausal status  Health Maintenance Last Pap: 06/14/2019. Results were: ASCUS negative HPV Last mammogram: 09/12/2019. Results were: normal Last colonoscopy: Never Last Dexa: N/A  Past medical history, past surgical history, family history and social history were all reviewed and documented in the EPIC chart.  ROS:  A ROS was performed and pertinent positives and negatives are included.  Exam:  Vitals:   09/05/20 1409  BP: 120/86  Pulse: 80  Resp: 14  Weight: 158 lb 4 oz (71.8 kg)  Height: 5\' 5"  (1.651 m)   Body mass index is 26.33 kg/m.  General appearance:  Normal Thyroid:  Symmetrical, normal in size, without palpable masses or nodularity. Respiratory  Auscultation:  Clear without wheezing or rhonchi Cardiovascular  Auscultation:  Regular rate, without rubs, murmurs or gallops  Edema/varicosities:  Not grossly evident Abdominal  Soft,nontender, without masses, guarding or rebound.  Liver/spleen:  No organomegaly noted  Hernia:  None appreciated  Skin  Inspection:  Grossly normal   Breasts: Examined lying and sitting.   Right: Without masses, retractions, discharge or axillary adenopathy.   Left: Without masses, retractions, discharge or axillary adenopathy. Gentitourinary   Inguinal/mons:  Normal without inguinal adenopathy  External genitalia:  Normal  BUS/Urethra/Skene's glands:  Normal  Vagina:  Atrophic changes  Cervix:  Normal  Uterus:  Normal in size,  shape and contour.  Midline and mobile  Adnexa/parametria:     Rt: Without masses or tenderness.   Lt: Without masses or tenderness.  Anus and perineum: Normal  Digital rectal exam: Normal sphincter tone without palpated masses or tenderness  Assessment/Plan:  50 y.o. [id number] for annual exam.   Well female exam with routine gynecological exam - Education provided on SBEs, importance of preventative screenings, current guidelines, high calcium diet, regular exercise, and multivitamin daily. Labs with PCP.   Hormone replacement therapy (HRT) - Plan: estradiol (VIVELLE-DOT) 0.0375 MG/24HR, progesterone (PROMETRIUM) 100 MG capsule.  Good relief of hot flashes. We discussed the benefits and risks of HRT. She would like to continue. Refill x 1 year provided.  Mixed incontinence urge and stress - Has noticed more urgency and incontinence with certain activities, such as exercise that has progressed over the past year or so. She has had a 12 pound weight gain since last visit 2 years ago. We discussed how abdominal weight gain can affect the bladder. We also discussed vaginal weakness and recommendations for Kegell exercises and pelvic floor strengthening exercises. We also talked about referral for Pelvic Floor PT. She would like to try exercises and weight loss first.   Screening for cervical cancer -cryo greater than 10 years ago, subsequent Paps normal. Will repeat at 3-year interval per guidelines.  Screening for breast cancer - Normal mammogram history.  Continue annual screenings.  Normal breast exam today.  Screening for colon cancer -has not had screening colonoscopy.  Plans to do Cologuard through PCP.  Follow up in 1 year for annual.     Tamela Gammon [facility], 2:30 PM 09/05/2020

## 2020-09-05 NOTE — Patient Instructions (Signed)

## 2020-09-30 LAB — COLOGUARD: COLOGUARD: NEGATIVE

## 2020-10-03 ENCOUNTER — Other Ambulatory Visit: Payer: Self-pay

## 2020-10-03 ENCOUNTER — Ambulatory Visit
Admission: RE | Admit: 2020-10-03 | Discharge: 2020-10-03 | Disposition: A | Discharge status: Home / Self Care | Payer: PRIVATE HEALTH INSURANCE | Source: Ambulatory Visit | Attending: Nurse Practitioner | Admitting: Nurse Practitioner

## 2020-10-03 DIAGNOSIS — Z1231 Encounter for screening mammogram for malignant neoplasm of breast: Secondary | ICD-10-CM

## 2020-11-15 ENCOUNTER — Telehealth: Payer: Self-pay | Admitting: *Deleted

## 2020-11-15 NOTE — Telephone Encounter (Signed)
Merrilee Seashore from [facility] left voicemail in regards to patient's progesterone 100mg  capsules. States they are no longer carrying the genetic NDC that was filled in January. Asking for authorization to dispense new NDC for next refill. Call back #: [phone]) [phone].   Routing to provider for review.

## 2020-11-18 NOTE — Telephone Encounter (Signed)
Yes it is okay to send new genetic for next refill. Thank you.

## 2020-11-19 NOTE — Telephone Encounter (Signed)
Returned call to [facility] and advised of message as seen below. Spoke with Missy who verbalized understanding.   Encounter closed.

## 2021-07-02 ENCOUNTER — Other Ambulatory Visit: Payer: Self-pay

## 2021-07-02 DIAGNOSIS — Z79890 Hormone replacement therapy (postmenopausal): Secondary | ICD-10-CM

## 2021-07-02 MED ORDER — ESTRADIOL 0.0375 MG/24HR TD PTTW: 1.0000 | MEDICATED_PATCH | TRANSDERMAL | 0 refills | Status: DC

## 2021-07-02 NOTE — Telephone Encounter (Signed)
Last AEX 09/05/20. Scheduled

## 2021-07-02 NOTE — Telephone Encounter (Signed)
Last AEX 09/05/20. Scheduled 08/27/21 ( I asked appt desk to be sure it does not need to be scheduled after 1/20)

## 2021-08-27 ENCOUNTER — Ambulatory Visit: Payer: BC Managed Care – PPO | Admitting: Nurse Practitioner

## 2021-09-10 ENCOUNTER — Ambulatory Visit: Payer: BC Managed Care – PPO | Admitting: Nurse Practitioner

## 2021-09-10 NOTE — Progress Notes (Deleted)
Bethany Mata 1970-11-07 [id number]   History:  51 y.o. [id number] presents for annual exam. Postmenopausal on HRT with good relief of hot flashes. Cryo greater than 10 years ago, subsequent paps normal. Normal mammogram history. HTN, hypothyroidism managed by PCP.   Gynecologic History No LMP recorded. Patient has had an ablation.   Contraception/Family planning: post menopausal status Sexually active: ***  Health Maintenance Last Pap: 06/14/2019. Results were: ASCUS negative HPV, 3-year repeat Last mammogram: 09/12/2019. Results were: normal Last colonoscopy: Never Last Dexa: Not indicated  Past medical history, past surgical history, family history and social history were all reviewed and documented in the EPIC chart. Married. Daughter.   ROS:  A ROS was performed and pertinent positives and negatives are included.  Exam:  There were no vitals filed for this visit.  There is no height or weight on file to calculate BMI.  General appearance:  Normal Thyroid:  Symmetrical, normal in size, without palpable masses or nodularity. Respiratory  Auscultation:  Clear without wheezing or rhonchi Cardiovascular  Auscultation:  Regular rate, without rubs, murmurs or gallops  Edema/varicosities:  Not grossly evident Abdominal  Soft,nontender, without masses, guarding or rebound.  Liver/spleen:  No organomegaly noted  Hernia:  None appreciated  Skin  Inspection:  Grossly normal   Breasts: Examined lying and sitting.   Right: Without masses, retractions, discharge or axillary adenopathy.   Left: Without masses, retractions, discharge or axillary adenopathy. Genitourinary   Inguinal/mons:  Normal without inguinal adenopathy  External genitalia:  Normal appearing vulva with no masses, tenderness, or lesions  BUS/Urethra/Skene's glands:  Normal  Vagina:  Normal appearing with normal color and discharge, no lesions  Cervix:  Normal appearing without discharge or lesions  Uterus:   Normal in size, shape and contour.  Midline and mobile, nontender  Adnexa/parametria:     Rt: Normal in size, without masses or tenderness.   Lt: Normal in size, without masses or tenderness.  Anus and perineum: Normal  Digital rectal exam: Normal sphincter tone without palpated masses or tenderness  Patient informed chaperone available to be present for breast and pelvic exam. Patient has requested no chaperone to be present. Patient has been advised what will be completed during breast and pelvic exam.   Assessment/Plan:  51 y.o. [id number] for annual exam.   Well female exam with routine gynecological exam - Education provided on SBEs, importance of preventative screenings, current guidelines, high calcium diet, regular exercise, and multivitamin daily. Labs with PCP.   Hormone replacement therapy (HRT) - Plan: estradiol (VIVELLE-DOT) 0.0375 MG/24HR, progesterone (PROMETRIUM) 100 MG capsule.  Good relief of hot flashes. We discussed the benefits and risks of HRT. She would like to continue. Refill x 1 year provided.   Mixed incontinence urge and stress - Has noticed more urgency and incontinence with certain activities, such as exercise that has progressed over the past year or so. She has had a 12 pound weight gain since last visit 2 years ago. We discussed how abdominal weight gain can affect the bladder. We also discussed vaginal weakness and recommendations for Kegell exercises and pelvic floor strengthening exercises. We also talked about referral for Pelvic Floor PT. She would like to try exercises and weight loss first.   Screening for cervical cancer -cryo greater than 10 years ago, subsequent Paps normal. Will repeat at 3-year interval per guidelines.  Screening for breast cancer - Normal mammogram history.  Continue annual screenings.  Normal breast exam today.  Screening for colon cancer -has  not had screening colonoscopy.  Plans to do Cologuard through PCP.  Follow up in 1 year for  annual.     Tamela Gammon [facility], 10:39 AM 09/10/2021

## 2021-09-17 ENCOUNTER — Ambulatory Visit (INDEPENDENT_AMBULATORY_CARE_PROVIDER_SITE_OTHER): Payer: 59 | Admitting: Nurse Practitioner

## 2021-09-17 ENCOUNTER — Other Ambulatory Visit: Payer: Self-pay | Admitting: Nurse Practitioner

## 2021-09-17 ENCOUNTER — Encounter: Payer: Self-pay | Admitting: Nurse Practitioner

## 2021-09-17 ENCOUNTER — Other Ambulatory Visit (HOSPITAL_COMMUNITY)
Admission: RE | Admit: 2021-09-17 | Discharge: 2021-09-17 | Disposition: A | Discharge status: Home / Self Care | Payer: BC Managed Care – PPO | Source: Ambulatory Visit | Attending: Nurse Practitioner | Admitting: Nurse Practitioner

## 2021-09-17 ENCOUNTER — Other Ambulatory Visit: Payer: Self-pay

## 2021-09-17 VITALS — BP 116/86 | Ht 64.75 in | Wt 161.0 lb

## 2021-09-17 DIAGNOSIS — Z79890 Hormone replacement therapy (postmenopausal): Secondary | ICD-10-CM

## 2021-09-17 DIAGNOSIS — Z1231 Encounter for screening mammogram for malignant neoplasm of breast: Secondary | ICD-10-CM

## 2021-09-17 DIAGNOSIS — Z01419 Encounter for gynecological examination (general) (routine) without abnormal findings: Secondary | ICD-10-CM | POA: Insufficient documentation

## 2021-09-17 NOTE — Progress Notes (Signed)
° °  Bethany Mata 07/20/1971 [id number]   History:  51 y.o. [id number] presents for annual exam. Postmenopausal on HRT with good relief of hot flashes. Cryo greater than 10 years ago, subsequent paps normal. Normal mammogram history. HTN, hypothyroidism managed by PCP.   Gynecologic History No LMP recorded. Patient has had an ablation. Period Duration (Days): amenorrhea, hx endometrial ablation Contraception/Family planning: post menopausal status Sexually active: Yes  Health Maintenance Last Pap: 06/14/2019. Results were: ASCUS negative HPV, 3-year repeat Last mammogram:  10/03/2020. Results were: Normal Last colonoscopy: Never. Cologuard 2022 negative Last Dexa: Not indicated  Past medical history, past surgical history, family history and social history were all reviewed and documented in the EPIC chart. Married. Working for [employer]. Daughter lives at home. Also has son and stepdaughter.   ROS:  A ROS was performed and pertinent positives and negatives are included.  Exam:  Vitals:   09/17/21 1504  BP: 116/86  Weight: 161 lb (73 kg)  Height: 5' 4.75" (1.645 m)    Body mass index is 27 kg/m.  General appearance:  Normal Thyroid:  Symmetrical, normal in size, without palpable masses or nodularity. Respiratory  Auscultation:  Clear without wheezing or rhonchi Cardiovascular  Auscultation:  Regular rate, without rubs, murmurs or gallops  Edema/varicosities:  Not grossly evident Abdominal  Soft,nontender, without masses, guarding or rebound.  Liver/spleen:  No organomegaly noted  Hernia:  None appreciated  Skin  Inspection:  Grossly normal   Breasts: Examined lying and sitting. Bilateral implants noted  Right: Without masses, retractions, discharge or axillary adenopathy.   Left: Without masses, retractions, discharge or axillary adenopathy. Genitourinary   Inguinal/mons:  Normal without inguinal adenopathy  External genitalia:  Normal appearing vulva with no masses,  tenderness, or lesions  BUS/Urethra/Skene's glands:  Normal  Vagina:  Normal appearing with normal color and discharge, no lesions  Cervix:  Normal appearing without discharge or lesions  Uterus:  Normal in size, shape and contour.  Midline and mobile, nontender  Adnexa/parametria:     Rt: Normal in size, without masses or tenderness.   Lt: Normal in size, without masses or tenderness.  Anus and perineum: Normal  Digital rectal exam: Normal sphincter tone without palpated masses or tenderness  Patient informed chaperone available to be present for breast and pelvic exam. Patient has requested no chaperone to be present. Patient has been advised what will be completed during breast and pelvic exam.   Assessment/Plan:  51 y.o. [id number] for annual exam.   Well female exam with routine gynecological exam - Education provided on SBEs, importance of preventative screenings, current guidelines, high calcium diet, regular exercise, and multivitamin daily. Labs with PCP.   Hormone replacement therapy (HRT) - Plan: estradiol (VIVELLE-DOT) 0.0375 MG/24HR, progesterone (PROMETRIUM) 100 MG capsule.  Good relief of hot flashes. We discussed the benefits and risks of HRT. She would like to continue. She is required to use mail order through insurance and will send [place] the information for refills.   Screening for cervical cancer - Cryo greater than 10 years ago, subsequent paps normal. Pap today.   Screening for breast cancer - Normal mammogram history.  Continue annual screenings.  Normal breast exam today.  Screening for colon cancer - Negative Cologuard in 2022. Will repeat at 3-year interval per recommendations.   Follow up in 1 year for annual.        Tamela Gammon [facility], 3:27 PM 09/17/2021

## 2021-09-19 LAB — CYTOLOGY - PAP
Comment: NEGATIVE
Diagnosis: UNDETERMINED — AB
High risk HPV: POSITIVE — AB

## 2021-09-25 ENCOUNTER — Encounter: Payer: Self-pay | Admitting: Nurse Practitioner

## 2021-09-25 ENCOUNTER — Other Ambulatory Visit: Payer: Self-pay | Admitting: Nurse Practitioner

## 2021-09-25 DIAGNOSIS — Z79890 Hormone replacement therapy (postmenopausal): Secondary | ICD-10-CM

## 2021-09-25 MED ORDER — ESTRADIOL 0.0375 MG/24HR TD PTTW: 1.0000 | MEDICATED_PATCH | TRANSDERMAL | 3 refills | Status: DC

## 2021-09-25 MED ORDER — PROGESTERONE MICRONIZED 100 MG PO CAPS: 100.0000 mg | ORAL_CAPSULE | Freq: Every day | ORAL | 3 refills | Status: DC

## 2021-10-06 ENCOUNTER — Ambulatory Visit
Admission: RE | Admit: 2021-10-06 | Discharge: 2021-10-06 | Disposition: A | Discharge status: Home / Self Care | Payer: 59 | Source: Ambulatory Visit | Attending: Nurse Practitioner | Admitting: Nurse Practitioner

## 2021-10-06 DIAGNOSIS — Z1231 Encounter for screening mammogram for malignant neoplasm of breast: Secondary | ICD-10-CM

## 2022-01-19 ENCOUNTER — Other Ambulatory Visit (HOSPITAL_COMMUNITY): Payer: Self-pay

## 2022-02-23 ENCOUNTER — Encounter: Payer: Self-pay | Admitting: Nurse Practitioner

## 2022-02-23 DIAGNOSIS — Z79890 Hormone replacement therapy (postmenopausal): Secondary | ICD-10-CM

## 2022-02-24 ENCOUNTER — Other Ambulatory Visit (HOSPITAL_COMMUNITY): Payer: Self-pay

## 2022-02-24 MED ORDER — ESTRADIOL 0.0375 MG/24HR TD PTTW
1.0000 | MEDICATED_PATCH | TRANSDERMAL | 1 refills | Status: DC
  Filled 2022-02-24: qty 8, 28d supply, fill #0
  Filled 2022-04-02: qty 8, 28d supply, fill #1

## 2022-02-24 MED ORDER — PROGESTERONE MICRONIZED 100 MG PO CAPS
100.0000 mg | ORAL_CAPSULE | Freq: Every day | ORAL | 1 refills | Status: DC
  Filled 2022-02-24: qty 30, 30d supply, fill #0
  Filled 2022-04-02: qty 30, 30d supply, fill #1

## 2022-02-24 NOTE — Telephone Encounter (Signed)
[name] patient would like Rx's sent to [facility]. I have Rx pending for you to sign and approve any refills. Last annual exam was 09/2021

## 2022-04-03 ENCOUNTER — Other Ambulatory Visit (HOSPITAL_COMMUNITY): Payer: Self-pay

## 2022-04-03 ENCOUNTER — Encounter: Payer: Self-pay | Admitting: Nurse Practitioner

## 2022-04-03 DIAGNOSIS — Z79890 Hormone replacement therapy (postmenopausal): Secondary | ICD-10-CM

## 2022-04-03 MED ORDER — PROGESTERONE MICRONIZED 100 MG PO CAPS: 100.0000 mg | ORAL_CAPSULE | Freq: Every day | ORAL | 1 refills | Status: DC

## 2022-04-03 MED ORDER — ESTRADIOL 0.0375 MG/24HR TD PTTW: 1.0000 | MEDICATED_PATCH | TRANSDERMAL | 1 refills | Status: DC

## 2022-08-24 ENCOUNTER — Other Ambulatory Visit: Payer: Self-pay | Admitting: Nurse Practitioner

## 2022-08-24 DIAGNOSIS — Z79890 Hormone replacement therapy (postmenopausal): Secondary | ICD-10-CM

## 2022-08-25 NOTE — Telephone Encounter (Signed)
Last AEX 09/17/2021--nothing scheduled, Recall placed. Last mammo 10/06/2021-neg birads 1  Per pharmacy: Last fill 06/20/2022.

## 2022-08-29 ENCOUNTER — Other Ambulatory Visit: Payer: Self-pay | Admitting: Nurse Practitioner

## 2022-08-29 DIAGNOSIS — Z79890 Hormone replacement therapy (postmenopausal): Secondary | ICD-10-CM

## 2022-09-01 NOTE — Telephone Encounter (Signed)
Med refill request: Progesterone 100 mg caps Last AEX: 09/17/21 /TW Next AEX: 09/22/22/ TW Last MMG (if hormonal med) 10/06/21, BiRads 1, neg  Call placed to patient. AEX scheduled for 09/22/22 at 1530. Patient states she has enough of current RX of progesterone and estradiol, will request refills at AEX.   Rx refused.  Routing to provider for final review. Patient is agreeable to disposition. Will close encounter.

## 2022-09-06 ENCOUNTER — Other Ambulatory Visit: Payer: Self-pay | Admitting: Nurse Practitioner

## 2022-09-06 DIAGNOSIS — Z79890 Hormone replacement therapy (postmenopausal): Secondary | ICD-10-CM

## 2022-09-22 ENCOUNTER — Encounter: Payer: Self-pay | Admitting: Nurse Practitioner

## 2022-09-22 ENCOUNTER — Ambulatory Visit (INDEPENDENT_AMBULATORY_CARE_PROVIDER_SITE_OTHER): Payer: BC Managed Care – PPO | Admitting: Nurse Practitioner

## 2022-09-22 ENCOUNTER — Other Ambulatory Visit (HOSPITAL_COMMUNITY)
Admission: RE | Admit: 2022-09-22 | Discharge: 2022-09-22 | Disposition: A | Discharge status: Home / Self Care | Payer: BC Managed Care – PPO | Source: Ambulatory Visit | Attending: Nurse Practitioner | Admitting: Nurse Practitioner

## 2022-09-22 VITALS — BP 110/72 | HR 101 | Resp 16 | Ht 64.25 in | Wt 158.0 lb

## 2022-09-22 DIAGNOSIS — R87810 Cervical high risk human papillomavirus (HPV) DNA test positive: Secondary | ICD-10-CM

## 2022-09-22 DIAGNOSIS — R87610 Atypical squamous cells of undetermined significance on cytologic smear of cervix (ASC-US): Secondary | ICD-10-CM | POA: Insufficient documentation

## 2022-09-22 DIAGNOSIS — Z01419 Encounter for gynecological examination (general) (routine) without abnormal findings: Secondary | ICD-10-CM | POA: Diagnosis not present

## 2022-09-22 DIAGNOSIS — Z79890 Hormone replacement therapy (postmenopausal): Secondary | ICD-10-CM

## 2022-09-22 MED ORDER — PROGESTERONE MICRONIZED 100 MG PO CAPS: 100.0000 mg | ORAL_CAPSULE | Freq: Every day | ORAL | 3 refills | Status: DC

## 2022-09-22 MED ORDER — ESTRADIOL 0.0375 MG/24HR TD PTTW: 1.0000 | MEDICATED_PATCH | TRANSDERMAL | 3 refills | Status: DC

## 2022-09-22 NOTE — Progress Notes (Signed)
   Bethany Mata 02-Aug-1971 [id number]   History:  52 y.o. [id number] presents for annual exam. Postmenopausal on HRT with good relief of hot flashes. Cryo greater than 10 years ago, subsequent paps normal. Normal mammogram history. HTN, hypothyroidism managed by PCP.   Gynecologic History Patient's last menstrual period was 10/31/2013.   Contraception/Family planning: post menopausal status Sexually active: Yes  Health Maintenance Last Pap: 09/17/2021. Results were: ASCUS + HR HPV Last mammogram:  10/06/2021. Results were: Normal Last colonoscopy: Never. Cologuard 2022 negative Last Dexa: Not indicated  Past medical history, past surgical history, family history and social history were all reviewed and documented in the EPIC chart. Married. Working for [employer]. Husband on disability d/t stroke in 2021. Daughter lives at home. Also has son and stepdaughter.   ROS:  A ROS was performed and pertinent positives and negatives are included.  Exam:  Vitals:   09/22/22 1516  BP: 110/72  Pulse: (!) 101  Resp: 16  SpO2: 98%  Weight: 158 lb (71.7 kg)  Height: 5' 4.25" (1.632 m)     Body mass index is 26.91 kg/m.  General appearance:  Normal Thyroid:  Symmetrical, normal in size, without palpable masses or nodularity. Respiratory  Auscultation:  Clear without wheezing or rhonchi Cardiovascular  Auscultation:  Regular rate, without rubs, murmurs or gallops  Edema/varicosities:  Not grossly evident Abdominal  Soft,nontender, without masses, guarding or rebound.  Liver/spleen:  No organomegaly noted  Hernia:  None appreciated  Skin  Inspection:  Grossly normal   Breasts: Examined lying and sitting. Bilateral implants noted  Right: Without masses, retractions, discharge or axillary adenopathy.   Left: Without masses, retractions, discharge or axillary adenopathy. Genitourinary   Inguinal/mons:  Normal without inguinal adenopathy  External genitalia:  Normal appearing vulva with  no masses, tenderness, or lesions  BUS/Urethra/Skene's glands:  Normal  Vagina:  Normal appearing with normal color and discharge, no lesions  Cervix:  Normal appearing without discharge or lesions  Uterus:  Normal in size, shape and contour.  Midline and mobile, nontender  Adnexa/parametria:     Rt: Normal in size, without masses or tenderness.   Lt: Normal in size, without masses or tenderness.  Anus and perineum: Normal  Digital rectal exam: Deferred  Patient informed chaperone available to be present for breast and pelvic exam. Patient has requested no chaperone to be present. Patient has been advised what will be completed during breast and pelvic exam.   Assessment/Plan:  52 y.o. [id number] for annual exam.   Well female exam with routine gynecological exam - Education provided on SBEs, importance of preventative screenings, current guidelines, high calcium diet, regular exercise, and multivitamin daily. Labs with PCP.   Hormone replacement therapy (HRT) - Plan: estradiol (VIVELLE-DOT) 0.0375 MG/24HR twice weekly, progesterone (PROMETRIUM) 100 MG capsule nightly.  Good relief of hot flashes. We discussed the benefits and risks of HRT. She would like to continue. Refill x 1 year provided.   Screening for cervical cancer - Cryo greater than 10 years ago, 2020 ASCUS neg HPV, 09/2021 ASCUS + HR HPV. Pap today.   Screening for breast cancer - Normal mammogram history.  Continue annual screenings.  Normal breast exam today.  Screening for colon cancer - Negative Cologuard in 2022. Will repeat at 3-year interval per recommendations.   Follow up in 1 year for annual.        Tamela Gammon [facility], 3:44 PM 09/22/2022

## 2022-09-24 LAB — CYTOLOGY - PAP
Comment: NEGATIVE
Diagnosis: UNDETERMINED — AB
High risk HPV: NEGATIVE

## 2022-10-01 ENCOUNTER — Other Ambulatory Visit: Payer: Self-pay

## 2022-10-01 ENCOUNTER — Other Ambulatory Visit: Payer: Self-pay | Admitting: Nurse Practitioner

## 2022-10-01 DIAGNOSIS — R87610 Atypical squamous cells of undetermined significance on cytologic smear of cervix (ASC-US): Secondary | ICD-10-CM

## 2022-10-01 DIAGNOSIS — Z1231 Encounter for screening mammogram for malignant neoplasm of breast: Secondary | ICD-10-CM

## 2022-10-13 ENCOUNTER — Encounter: Payer: Self-pay | Admitting: Nurse Practitioner

## 2022-11-06 ENCOUNTER — Encounter: Payer: Self-pay | Admitting: Nurse Practitioner

## 2022-11-16 ENCOUNTER — Ambulatory Visit
Admission: RE | Admit: 2022-11-16 | Discharge: 2022-11-16 | Disposition: A | Discharge status: Home / Self Care | Payer: BC Managed Care – PPO | Source: Ambulatory Visit | Attending: Nurse Practitioner | Admitting: Nurse Practitioner

## 2022-11-16 DIAGNOSIS — Z1231 Encounter for screening mammogram for malignant neoplasm of breast: Secondary | ICD-10-CM

## 2022-12-02 NOTE — Progress Notes (Signed)
GYNECOLOGY  VISIT   HPI: 52 y.o.   Married  Caucasian  female   [phone] with Patient's last menstrual period was 10/31/2013.   here for   colposcopy.   Multiple paps showing ASCUS in 2020, 2023, and 2024.  HR HPV was positive in 2023.  No prior colposcopy.   Hx positive HR HPV in 2023.   GYNECOLOGIC HISTORY: Patient's last menstrual period was 10/31/2013. Contraception:  PMP/vasectomy Menopausal hormone therapy:  estradiol patch, prometrium Last mammogram:  09/2022 per pt, 08/30/17 Breast Density Cat C, BI-RADS CAT 1 neg Last pap smear:   09/22/22 ASCUS: HR HPV neg, 09/17/21 ASCUS: HR HPV positive        OB History     Gravida  3   Para  2   Term  2   Preterm      AB  1   Living  2      SAB      IAB  1   Ectopic      Multiple      Live Births                 Patient Active Problem List   Diagnosis Date Noted   Cough 07/27/2014   Unspecified hypothyroidism 10/26/2012   Essential hypertension, benign 10/26/2012    Past Medical History:  Diagnosis Date   Elevated cholesterol    Hypertension    Seasonal allergies     Past Surgical History:  Procedure Laterality Date   AUGMENTATION MAMMAPLASTY Bilateral 06/2016   saline implants    BREAST SURGERY     ENDOMETRIAL ABLATION  3/13   Cryoablation   GYNECOLOGIC CRYOSURGERY     REFRACTIVE SURGERY  5-12   BILATERAL    Current Outpatient Medications  Medication Sig Dispense Refill   Cholecalciferol (VITAMIN D3 PO) Take by mouth.     diphenhydrAMINE (BENADRYL) 50 MG tablet Take 50 mg by mouth at bedtime as needed for itching.     estradiol (VIVELLE-DOT) 0.0375 MG/24HR Place 1 patch onto the skin 2 (two) times a week. 24 patch 3   fluticasone (FLONASE) 50 MCG/ACT nasal spray Place into both nostrils daily.     hydrochlorothiazide (MICROZIDE) 12.5 MG capsule Take by mouth.     levothyroxine (SYNTHROID) 100 MCG tablet Take 100 mcg by mouth daily.     loratadine (CLARITIN) 10 MG tablet Take 10 mg by mouth  daily.     losartan (COZAAR) 100 MG tablet Take 1 tablet by mouth daily.     metaxalone (SKELAXIN) 800 MG tablet Take by mouth.     methylphenidate 36 MG PO CR tablet Take 36 mg by mouth daily.     Omega-3 1000 MG CAPS Take by mouth.     progesterone (PROMETRIUM) 100 MG capsule Take 1 capsule (100 mg total) by mouth daily. 90 capsule 3   VITAMIN E PO Take by mouth.     zolpidem (AMBIEN) 5 MG tablet Take 1 tablet by mouth at bedtime as needed.     Multiple Vitamins-Minerals (PX COMPLETE SENIOR MULTIVITS) TABS Take 1 tablet by mouth daily. (Patient not taking: Reported on 12/16/2022)     No current facility-administered medications for this visit.     ALLERGIES: Codeine  Family History  Problem Relation Age of Onset   Diabetes Mother    Hypertension Mother    Thyroid disease Mother    Hypertension Father    Diabetes Paternal Grandmother     Social History  Socioeconomic History   Marital status: Married    Spouse name: Not on file   Number of children: Not on file   Years of education: Not on file   Highest education level: Not on file  Occupational History   Not on file  Tobacco Use   Smoking status: Never   Smokeless tobacco: Never  Vaping Use   Vaping Use: Never used  Substance and Sexual Activity   Alcohol use: Yes    Comment: SOCIAL   Drug use: No   Sexual activity: Yes    Partners: Male    Birth control/protection: Other-see comments, Post-menopausal    Comment: SPOUSE VASECTOMY  Other Topics Concern   Not on file  Social History Narrative   Not on file   Social Determinants of Health   Financial Resource Strain: Not on file  Food Insecurity: Not on file  Transportation Needs: Not on file  Physical Activity: Not on file  Stress: Not on file  Social Connections: Not on file  Intimate Partner Violence: Not on file    Review of Systems  All other systems reviewed and are negative.   PHYSICAL EXAMINATION:    BP 136/84 (BP Location: Left Arm, Patient  Position: Sitting, Cuff Size: Normal)   Pulse 76   Ht 5' 4.25" (1.632 m)   Wt 164 lb (74.4 kg)   LMP 10/31/2013   SpO2 96%   BMI 27.93 kg/m     General appearance: alert, cooperative and appears stated age   Colposcopy - cervix, vagina. Consent for procedure.  3% acetic acid used in vagina and on cervix.  White light and green light filter used.  Colposcopy satisfactory:  Yes   ___x__          No    _____ Findings:      Cervix:  subtle acetowhite change within the transformation zone at 4:00 and 9:00. Vagina:  no lesions.  Hibiclens to cervix.  Tenaculum to anterior cervical lip.  Biopsies:   4:00, 9:00, and ECC - all to pathology separately.  Monsel's placed.  Minimal EBL. No complications.   Chaperone was present for exam:  Warren Lacy, CMA  ASSESSMENT  Repeat paps showing ASCUS. Acetowhite change within the transformation zone.  On HRT.  PLAN  Follow up biopsies.  I anticipate a pap and HR HPV testing in 12 months.

## 2022-12-16 ENCOUNTER — Encounter: Payer: Self-pay | Admitting: Obstetrics and Gynecology

## 2022-12-16 ENCOUNTER — Other Ambulatory Visit (HOSPITAL_COMMUNITY)
Admission: RE | Admit: 2022-12-16 | Discharge: 2022-12-16 | Disposition: A | Discharge status: Home / Self Care | Payer: BC Managed Care – PPO | Source: Ambulatory Visit | Attending: Obstetrics and Gynecology | Admitting: Obstetrics and Gynecology

## 2022-12-16 ENCOUNTER — Ambulatory Visit: Payer: BC Managed Care – PPO | Admitting: Obstetrics and Gynecology

## 2022-12-16 VITALS — BP 136/84 | HR 76 | Ht 64.25 in | Wt 164.0 lb

## 2022-12-16 DIAGNOSIS — R87610 Atypical squamous cells of undetermined significance on cytologic smear of cervix (ASC-US): Secondary | ICD-10-CM

## 2022-12-16 DIAGNOSIS — Z8619 Personal history of other infectious and parasitic diseases: Secondary | ICD-10-CM | POA: Insufficient documentation

## 2022-12-16 NOTE — Patient Instructions (Signed)
Colposcopy, Care After  The following information offers guidance on how to care for yourself after your procedure. Your health care provider may also give you more specific instructions. If you have problems or questions, contact your health care provider. What can I expect after the procedure? If you had a colposcopy without a biopsy, you can expect to feel fine right away after your procedure. However, you may have some spotting of blood for a few days. You can return to your normal activities. If you had a colposcopy with a biopsy, it is common after the procedure to have: Soreness and mild pain. These may last for a few days. Mild vaginal bleeding or discharge that is dark-colored and grainy. This may last for a few days. The discharge may be caused by a liquid (solution) that was used during the procedure. You may need to wear a sanitary pad during this time. Spotting of blood for at least 48 hours after the procedure. Follow these instructions at home: Medicines Take over-the-counter and prescription medicines only as told by your health care provider. Talk with your health care provider about what type of over-the-counter pain medicines and prescription medicines you can start to take again. It is especially important to talk with your health care provider if you take blood thinners. Activity Avoid using douche products, using tampons, and having sex for at least 3 days after the procedure or for as long as told by your health care provider. Return to your normal activities as told by your health care provider. Ask your health care provider what activities are safe for you. General instructions Ask your health care provider if you may take baths, swim, or use a hot tub. You may take showers. If you use birth control (contraception), continue to use it. Keep all follow-up visits. This is important. Contact a health care provider if: You have a fever or chills. You faint or feel  light-headed. Get help right away if: You have heavy bleeding from your vagina or pass blood clots. Heavy bleeding is bleeding that soaks through a sanitary pad in less than 1 hour. You have vaginal discharge that is abnormal, is yellow in color, or smells bad. This could be a sign of infection. You have severe pain or cramps in your lower abdomen that do not go away with medicine. Summary If you had a colposcopy without a biopsy, you can expect to feel fine right away, but you may have some spotting of blood for a few days. You can return to your normal activities. If you had a colposcopy with a biopsy, it is common to have mild pain for a few days and spotting for 48 hours after the procedure. Avoid using douche products, using tampons, and having sex for at least 3 days after the procedure or for as long as told by your health care provider. Get help right away if you have heavy bleeding, severe pain, or signs of infection. This information is not intended to replace advice given to you by your health care provider. Make sure you discuss any questions you have with your health care provider. Document Revised: 12/29/2020 Document Reviewed: 12/29/2020 Elsevier Patient Education  2023 [employer] Inc[employer]  

## 2022-12-18 LAB — SURGICAL PATHOLOGY

## 2023-06-25 ENCOUNTER — Other Ambulatory Visit: Payer: Self-pay | Admitting: Nurse Practitioner

## 2023-06-25 DIAGNOSIS — Z79890 Hormone replacement therapy (postmenopausal): Secondary | ICD-10-CM

## 2023-06-25 NOTE — Telephone Encounter (Signed)
Med refill request: progesterone 100 mg Last AEX: 09/22/22 Next AEX: Last MMG (if hormonal med) Refill authorized: progesterone 100mg  #90 with 1 refill.  Sent to provider for review.

## 2023-07-08 ENCOUNTER — Other Ambulatory Visit: Payer: Self-pay

## 2023-07-08 ENCOUNTER — Ambulatory Visit: Payer: BC Managed Care – PPO | Admitting: Family Medicine

## 2023-07-08 VITALS — BP 126/88 | Ht 65.0 in | Wt 165.0 lb

## 2023-07-08 DIAGNOSIS — M7502 Adhesive capsulitis of left shoulder: Secondary | ICD-10-CM

## 2023-07-08 DIAGNOSIS — M25512 Pain in left shoulder: Secondary | ICD-10-CM

## 2023-07-08 DIAGNOSIS — M7712 Lateral epicondylitis, left elbow: Secondary | ICD-10-CM

## 2023-07-08 DIAGNOSIS — M25522 Pain in left elbow: Secondary | ICD-10-CM

## 2023-07-08 NOTE — Progress Notes (Signed)
PCP: No primary care provider on file.  Subjective:  CC: Lt shoulder pain, Lt elbow pain  HPI: Patient is a 52 y.o. female here for evaluation of left shoulder and left elbow pain.  Left shoulder pain started back in the summer when she was gardening.  She used a sling and Voltaren gel at that time which helped.  However, she is unsure what she has done, though the pain is back.  It is difficult for her to lift her shoulder up like her other one.  The pain sometimes radiates down to the bicep.  No motor or sensory changes.  Her left elbow pain started a couple weeks ago and is overall improving.  She is also been placing Voltaren gel on this.  She has tenderness on her lateral elbow and in the muscles right below it.  No specific movement of her arm that makes this worse.  Past Medical History:  Diagnosis Date   Elevated cholesterol    Hypertension    Seasonal allergies     Current Outpatient Medications on File Prior to Visit  Medication Sig Dispense Refill   Cholecalciferol (VITAMIN D3 PO) Take by mouth.     diphenhydrAMINE (BENADRYL) 50 MG tablet Take 50 mg by mouth at bedtime as needed for itching.     estradiol (VIVELLE-DOT) 0.0375 MG/24HR Place 1 patch onto the skin 2 (two) times a week. 24 patch 3   fluticasone (FLONASE) 50 MCG/ACT nasal spray Place into both nostrils daily.     hydrochlorothiazide (MICROZIDE) 12.5 MG capsule Take by mouth.     levothyroxine (SYNTHROID) 100 MCG tablet Take 100 mcg by mouth daily.     loratadine (CLARITIN) 10 MG tablet Take 10 mg by mouth daily.     losartan (COZAAR) 100 MG tablet Take 1 tablet by mouth daily.     metaxalone (SKELAXIN) 800 MG tablet Take by mouth.     methylphenidate 36 MG PO CR tablet Take 36 mg by mouth daily.     Multiple Vitamins-Minerals (PX COMPLETE SENIOR MULTIVITS) TABS Take 1 tablet by mouth daily. (Patient not taking: Reported on 12/16/2022)     Omega-3 1000 MG CAPS Take by mouth.     progesterone (PROMETRIUM) 100 MG  capsule Take 1 capsule by mouth daily. 90 capsule 0   VITAMIN E PO Take by mouth.     zolpidem (AMBIEN) 5 MG tablet Take 1 tablet by mouth at bedtime as needed.     No current facility-administered medications on file prior to visit.    Past Surgical History:  Procedure Laterality Date   AUGMENTATION MAMMAPLASTY Bilateral 06/2016   saline implants    BREAST SURGERY     ENDOMETRIAL ABLATION  3/13   Cryoablation   GYNECOLOGIC CRYOSURGERY     REFRACTIVE SURGERY  5-12   BILATERAL    Allergies  Allergen Reactions   Codeine Nausea Only    BP 126/88   Ht 5\' 5"  (1.651 m)   Wt 165 lb (74.8 kg)   LMP 10/31/2013   BMI 27.46 kg/m       No data to display              No data to display              Objective:  Physical Exam:  Gen: NAD, comfortable in exam room  Left shoulder: No swelling. No gross deformity. Mildly tender to palpation over anterior shoulder. Decreased AROM and PROM in all planes limited by about  50% with pain Positive Hawkins, positive Neers, positive Empty can Negative Yergasons. Strength 5/5 with resisted internal/external rotation. RT shoulder with FROM without pain, weakness, instability  Left elbow: No swelling, ecchymoses, gross deformity Mild tenderness to palpation over lateral epicondyle and common extensor tendon No pain with resisted wrist flexion, resisted wrist extension, and resisted 3rd finger extension Full range of motion without pain Strength 5/5 Negative valgus/varus testing NV intact distally  IMAGING: Limited ultrasound left shoulder  - Small hypoechoic fluid collection surrounding proximal biceps tendon in bicipital groove, hypoechoic change of proximal bicep tendon without  signs of tearing, seen in SAX and LAX views.  Normal pectoralis insertion. - Subscapularis: hypoechoic change at insertional footplate with small partial tearing seen in LAX and SAX - Supraspinatus: normal appearance in SAX and LAX views without  signs of tearing - Infraspinatus and Teres minor: normal appearance in SAX and LAX views without signs of tearing - AC: degenerative changes, no significant fluid in the joint - GH joint: no effusion  IMPRESSION: - bicep and rotator cuff tendinopathy - AC joint osteoarthritis  Limited ultrasound left elbow: Left lateral elbow with normal appearing common extensor tendon.  No increased doppler flow or neovascularization  IMPRESSION: - normal lateral elbow exam  Images and interpretation completed by Vonna Drafts, MD, Burna Forts, MD, and Darene Lamer, DO today  Assessment & Plan Adhesive capsulitis of left shoulder In the setting of likely minor injury while gardening in the summer and resultant pain with movement.    PLAN: - Dx and tx reviewed, timeline and outlook of condition reviewed - Recommended formal physical therapy to help increase range of motion - referral given - Discussed also using Voltaren gel or ibuprofen PO prn if topical therapy not helping - Follow-up in 6 weeks to reassess, could consider injection if not improving Lateral epicondylitis of left elbow In setting of frozen shoulder leading to compensation as well as likely overuse while gardening.    PLAN: - Can continue to use Voltaren gel or ibuprofen PO prn if topical therapy not helping - referral to PT given - Follow-up 6-weeks to reassess, sooner prn  Bethany Holmes, MD PGY-2, [facility]  Addendum:  Patient seen and examined in the office with resident - Burna Forts, PGY-2.  History, exam, plan of care were precepted with me.  Agree with findings and plan as documented in resident note with additions/modifications made above.  Darene Lamer, DO, CAQSM

## 2023-07-09 ENCOUNTER — Encounter: Payer: Self-pay | Admitting: Family Medicine

## 2023-07-29 ENCOUNTER — Ambulatory Visit (HOSPITAL_BASED_OUTPATIENT_CLINIC_OR_DEPARTMENT_OTHER): Payer: BC Managed Care – PPO | Attending: Family Medicine | Admitting: Physical Therapy

## 2023-07-29 ENCOUNTER — Encounter (HOSPITAL_BASED_OUTPATIENT_CLINIC_OR_DEPARTMENT_OTHER): Payer: Self-pay | Admitting: Physical Therapy

## 2023-07-29 DIAGNOSIS — M7712 Lateral epicondylitis, left elbow: Secondary | ICD-10-CM | POA: Diagnosis not present

## 2023-07-29 DIAGNOSIS — M25512 Pain in left shoulder: Secondary | ICD-10-CM | POA: Diagnosis present

## 2023-07-29 DIAGNOSIS — M25612 Stiffness of left shoulder, not elsewhere classified: Secondary | ICD-10-CM | POA: Insufficient documentation

## 2023-07-29 DIAGNOSIS — M7502 Adhesive capsulitis of left shoulder: Secondary | ICD-10-CM | POA: Diagnosis not present

## 2023-07-29 DIAGNOSIS — G8929 Other chronic pain: Secondary | ICD-10-CM | POA: Insufficient documentation

## 2023-07-29 DIAGNOSIS — M62838 Other muscle spasm: Secondary | ICD-10-CM | POA: Diagnosis present

## 2023-07-29 NOTE — Therapy (Signed)
Patient Name: Bethany Mata MRN: [id number] DOB:October 31, 1970, 52 y.o., female Today's Date: 07/29/2023   END OF SESSION:         Past Medical History:  Diagnosis Date   Elevated cholesterol     Hypertension     Seasonal allergies               Past Surgical History:  Procedure Laterality Date   AUGMENTATION MAMMAPLASTY Bilateral 06/2016    saline implants    BREAST SURGERY       ENDOMETRIAL ABLATION   3/13    Cryoablation   GYNECOLOGIC CRYOSURGERY       REFRACTIVE SURGERY   5-12    BILATERAL            Patient Active Problem List    Diagnosis Date Noted   Cough 07/27/2014   Hypothyroidism 10/26/2012   Essential hypertension, benign 10/26/2012      PCP: None recorded    REFERRING PROVIDER: Andi Devon, DO   REFERRING DIAG: [phone] (ICD-10-CM) - Pain in joint of left shoulder M25.522 (ICD-10-CM) - Left elbow pain   THERAPY DIAG:  No diagnosis found.   Rationale for Evaluation and Treatment: Rehabilitation   ONSET DATE: Summer of 2024    SUBJECTIVE:                                                                                                                                                                                       SUBJECTIVE STATEMENT: In August the patient was gardening and she felt a pull in her shoulder.  Since that point she has had significant pain in her shoulder.  She also had a period of time where her elbow hurt.  She feels like that is resolved.  She continues to have pain at night when she lies on the shoulder.  She feels like when she has to hold her arm up and perform activities her shoulder becomes more painful.  She has pain in left front and back of her shoulder.   Hand dominance: Right   PERTINENT HISTORY: HTN, HLD   PAIN:  Are you having pain? Yes: NPRS scale: 5/10 at worst over the past week  Pain location: in the front and can go down the side; has a spot in the back of the shoulder  Pain description: aching   Aggravating factors: use of the arm  Relieving factors: heat sometimes; voltarin    PRECAUTIONS: None   RED FLAGS: None       WEIGHT BEARING RESTRICTIONS: No   FALLS:  Has patient fallen in last 6 months? 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, broadcasting: gardening; reading     PLOF: Independent   PATIENT GOALS: To have less pain    OBJECTIVE: (objective measures from initial evaluation unless otherwise dated)     PATIENT SURVEYS:  FOTO 47% initial 66% expected in 10 min    COGNITION: Overall cognitive status: Within functional limits for tasks assessed                                     SENSATION: WFL   POSTURE: No Significant postural limitations    UPPER EXTREMITY ROM:    Active ROM Right eval Left eval  Shoulder flexion   108    Shoulder extension      Shoulder abduction      Shoulder adduction      Shoulder internal rotation   To buttock with pain    Shoulder external rotation    painful to reach her head   Elbow flexion      Elbow extension      Wrist flexion      Wrist extension      Wrist ulnar deviation      Wrist radial deviation      Wrist pronation      Wrist supination      (Blank rows = not tested) *=pain/symptoms  Active ROM Right eval Left eval  Shoulder flexion   132   Shoulder extension      Shoulder abduction      Shoulder adduction      Shoulder internal rotation   67   Shoulder external rotation    45   Elbow flexion      Elbow extension      Wrist flexion      Wrist extension      Wrist ulnar deviation      Wrist radial deviation      Wrist pronation      Wrist supination      (Blank rows = not tested) *=pain/symptoms   UPPER EXTREMITY MMT:   MMT Right eval Left eval  Shoulder flexion      Shoulder extension      Shoulder abduction      Shoulder adduction      Shoulder internal rotation      Shoulder external rotation      Middle trapezius      Lower trapezius      Elbow flexion       Elbow extension      Wrist flexion      Wrist extension      Wrist ulnar deviation      Wrist radial deviation      Wrist pronation      Wrist supination      Grip strength (lbs)      (Blank rows = not tested) *=pain/symptoms Not tested 2nd to pain        JOINT MOBILITY TESTING:  Access Code: [email or web address] URL: [email or web address] Date: 07/30/2023 Prepared by: Lorayne Bender  Program Notes pressure to the top of the shoulder and the front of the shoulder  Exercises - Seated Upper Trapezius Stretch  - 1 x daily - 7 x weekly - 3 sets - 10 reps - Theracane Over Shoulder  - 1 x daily - 7 x weekly - 3 sets - 10 reps - Seated Shoulder External Rotation  - 1 x daily - 7 x weekly - 3  sets - 10 reps - Standing Bilateral Low Shoulder Row with Anchored Resistance  - 1 x daily - 7 x weekly - 3 sets - 10 reps   PALPATION:  Tender to palpation in upper trap, posterior shoulder, and bicpes tendon               TODAY'S TREATMENT:                                                                                                                                         DATE:  07/29/23 Access Code: [email or web address] URL: [email or web address] Date: 07/30/2023 Prepared by: Lorayne Bender  Program Notes pressure to the top of the shoulder and the front of the shoulder  Exercises - Seated Upper Trapezius Stretch  - 1 x daily - 7 x weekly - 3 sets - 10 reps - Theracane Over Shoulder  - 1 x daily - 7 x weekly - 3 sets - 10 reps - Seated Shoulder External Rotation  - 1 x daily - 7 x weekly - 3 sets - 10 reps - Standing Bilateral Low Shoulder Row with Anchored Resistance  - 1 x daily - 7 x weekly - 3 sets - 10 reps  Reviewed self soft tissue mobilization    PATIENT EDUCATION:  Education details: Patient educated on exam findings, POC, scope of PT, HEP, . Person educated: Patient Education method: Explanation, Demonstration, and Handouts Education comprehension: verbalized  understanding, returned demonstration, verbal cues required, and tactile cues required   HOME EXERCISE PROGRAM: Access Code: [email or web address] URL: [email or web address] Date: 07/30/2023 Prepared by: Lorayne Bender    ASSESSMENT:   CLINICAL IMPRESSION: Patient a 52 y.o. y.o. female who was seen today for physical therapy evaluation and treatment for L shoulder and elbow pain. Patient presents with pain limited deficits in L shoulder/elbow strength, ROM, endurance, activity tolerance, and functional mobility with ADL. The patient has pain with active use of her left shoulder. Her elbow pain has resovled for the most part. We will continue to monitor. Therapy performed manual therapy to shoulder and she had some improvement with PROM.  OBJECTIVE IMPAIRMENTS: decreased activity tolerance, decreased ROM, decreased strength, impaired flexibility, improper body mechanics, and pain.    ACTIVITY LIMITATIONS: carrying, lifting, bending, bathing, dressing, reach over head, hygiene/grooming, and caring for others   PARTICIPATION LIMITATIONS: meal prep, cleaning, laundry, shopping, community activity, occupation, and yard work   PERSONAL FACTORS: Time since onset of injury/illness/exacerbation and 1-2 comorbidities: HTN, HLD  are also affecting patient's functional outcome.    REHAB POTENTIAL: Good   CLINICAL DECISION MAKING: Stable/uncomplicated   EVALUATION COMPLEXITY: Low     GOALS: Goals reviewed with patient? Yes   SHORT TERM GOALS: Target date: 08/27/2023     Patient will be independent with HEP in order to improve functional outcomes. Baseline: Goal status: INITIAL   2.  Patient will report at least 25% improvement in symptoms  for improved quality of life. Baseline:  Goal status: INITIAL   3.  Patient will improve passive ER by 20 degrees  Baseline:  Goal status: INITIAL   4.  Patient will increase left shoulder active flexion to 140 degrees  Baseline:  Goal status:  INITIAL   LONG TERM GOALS: Target date: 09/24/2023     Patient will report at least 75% improvement in symptoms for improved quality of life. Baseline:  Goal status: INITIAL   2.  Patient will improve FOTO score by at least 19 points in order to indicate improved tolerance to activity. Baseline:  Goal status: INITIAL   3.  Patient will demonstrate at least 150 in shoulder AROM in flexion for improved ability lift overhead. Baseline:  Goal status: INITIAL   4.  Patient will be able to return to all activities unrestricted for improved ability to perform work functions and participate with family.  Baseline:  Goal status: INITIAL   5.  Patient will demonstrate grade of 5/5 MMT grade in all tested musculature as evidence of improved strength to assist with lifting at home. Baseline:  Goal status: INITIAL      PLAN:   PT FREQUENCY: 1-2x/week   PT DURATION: 8 weeks   PLANNED INTERVENTIONS: 97164- PT Re-evaluation, 97110-Therapeutic exercises, 97530- Therapeutic activity, 97112- Neuromuscular re-education, 97535- Self Care, 16109- Manual therapy, [phone]- Gait training, [phone]- Orthotic Fit/training, [phone]- Canalith repositioning, [id number]- Aquatic Therapy, [phone]- Splinting, Patient/Family education, Balance training, Stair training, Taping, Dry Needling, Joint mobilization, Joint manipulation, Spinal manipulation, Spinal mobilization, Scar mobilization, and DME instructions.     PLAN FOR NEXT SESSION: consider dry needling f upper trap and sub-scap; continue with TP release to all painful area; consider wand press and pulley next visit; add shoulder extension if tolerated.

## 2023-07-30 ENCOUNTER — Encounter (HOSPITAL_BASED_OUTPATIENT_CLINIC_OR_DEPARTMENT_OTHER): Payer: Self-pay | Admitting: Physical Therapy

## 2023-07-30 ENCOUNTER — Other Ambulatory Visit: Payer: Self-pay

## 2023-08-04 ENCOUNTER — Ambulatory Visit (HOSPITAL_BASED_OUTPATIENT_CLINIC_OR_DEPARTMENT_OTHER): Payer: BC Managed Care – PPO | Admitting: Physical Therapy

## 2023-08-13 ENCOUNTER — Ambulatory Visit (HOSPITAL_BASED_OUTPATIENT_CLINIC_OR_DEPARTMENT_OTHER): Payer: BC Managed Care – PPO | Admitting: Physical Therapy

## 2023-08-13 ENCOUNTER — Encounter (HOSPITAL_BASED_OUTPATIENT_CLINIC_OR_DEPARTMENT_OTHER): Payer: Self-pay | Admitting: Physical Therapy

## 2023-08-13 DIAGNOSIS — M25512 Pain in left shoulder: Secondary | ICD-10-CM | POA: Diagnosis not present

## 2023-08-13 DIAGNOSIS — G8929 Other chronic pain: Secondary | ICD-10-CM

## 2023-08-13 DIAGNOSIS — M25612 Stiffness of left shoulder, not elsewhere classified: Secondary | ICD-10-CM

## 2023-08-13 DIAGNOSIS — M62838 Other muscle spasm: Secondary | ICD-10-CM

## 2023-08-13 NOTE — Therapy (Signed)
OUTPATIENT PHYSICAL THERAPY TREATMENT   Patient Name: Bethany Mata MRN: [id number] DOB:1971/06/19, 52 y.o., female Today's Date: 08/13/2023  END OF SESSION:  PT End of Session - 08/13/23 0841     Visit Number 2    Number of Visits 8    Date for PT Re-Evaluation 09/24/23    Authorization Type BCBS    Authorization Time Period 6 visits approved  From 12.12.2024 - 02.09.2025    Authorization - Visit Number 1    Authorization - Number of Visits 6    PT Start Time 0845    PT Stop Time 0925    PT Time Calculation (min) 40 min    Activity Tolerance Patient tolerated treatment well    Behavior During Therapy [facility] for tasks assessed/performed             Past Medical History:  Diagnosis Date   Elevated cholesterol    Hypertension    Seasonal allergies    Past Surgical History:  Procedure Laterality Date   AUGMENTATION MAMMAPLASTY Bilateral 06/2016   saline implants    BREAST SURGERY     ENDOMETRIAL ABLATION  3/13   Cryoablation   GYNECOLOGIC CRYOSURGERY     REFRACTIVE SURGERY  5-12   BILATERAL   Patient Active Problem List   Diagnosis Date Noted   Cough 07/27/2014   Hypothyroidism 10/26/2012   Essential hypertension, benign 10/26/2012      PCP: None recorded    REFERRING PROVIDER: Andi Devon, DO   REFERRING DIAG: [phone] (ICD-10-CM) - Pain in joint of left shoulder M25.522 (ICD-10-CM) - Left elbow pain   THERAPY DIAG:  No diagnosis found.   Rationale for Evaluation and Treatment: Rehabilitation   ONSET DATE: Summer of 2024    SUBJECTIVE:                                                                                                                                                                                       SUBJECTIVE STATEMENT: Patient states did not remember HEP all the time. Doing neck stretches. Feeling about the same overall.   EVAL: In August the patient was gardening and she felt a pull in her shoulder.  Since that point she has  had significant pain in her shoulder.  She also had a period of time where her elbow hurt.  She feels like that is resolved.  She continues to have pain at night when she lies on the shoulder.  She feels like when she has to hold her arm up and perform activities her shoulder becomes more painful.  She has pain in left front and back of her shoulder.  Hand dominance: Right   PERTINENT HISTORY: HTN, HLD   PAIN:  Are you having pain? Yes: NPRS scale: 5/10 at worst over the past week  Pain location: in the front and can go down the side; has a spot in the back of the shoulder  Pain description: aching  Aggravating factors: use of the arm  Relieving factors: heat sometimes; voltarin    PRECAUTIONS: None   RED FLAGS: None       WEIGHT BEARING RESTRICTIONS: No   FALLS:  Has patient fallen in last 6 months? 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, broadcasting: gardening; reading     PLOF: Independent   PATIENT GOALS: To have less pain    OBJECTIVE: (objective measures from initial evaluation unless otherwise dated)     PATIENT SURVEYS:  FOTO 47% initial 66% expected in 10 min    COGNITION: Overall cognitive status: Within functional limits for tasks assessed                                     SENSATION: WFL   POSTURE: No Significant postural limitations    UPPER EXTREMITY ROM:    Active ROM Right eval Left eval  Shoulder flexion   108    Shoulder extension      Shoulder abduction      Shoulder adduction      Shoulder internal rotation   To buttock with pain    Shoulder external rotation    painful to reach her head   Elbow flexion      Elbow extension      Wrist flexion      Wrist extension      Wrist ulnar deviation      Wrist radial deviation      Wrist pronation      Wrist supination      (Blank rows = not tested) *=pain/symptoms  Active ROM Right eval Left eval Left 08/13/23  Shoulder flexion   132  AAROM 151  Shoulder  extension       Shoulder abduction       Shoulder adduction       Shoulder internal rotation   67    Shoulder external rotation    45    Elbow flexion       Elbow extension       Wrist flexion       Wrist extension       Wrist ulnar deviation       Wrist radial deviation       Wrist pronation       Wrist supination       (Blank rows = not tested) *=pain/symptoms   UPPER EXTREMITY MMT:   MMT Right eval Left eval  Shoulder flexion      Shoulder extension      Shoulder abduction      Shoulder adduction      Shoulder internal rotation      Shoulder external rotation      Middle trapezius      Lower trapezius      Elbow flexion      Elbow extension      Wrist flexion      Wrist extension      Wrist ulnar deviation      Wrist radial deviation      Wrist pronation  Wrist supination      Grip strength (lbs)      (Blank rows = not tested) *=pain/symptoms Not tested 2nd to pain        JOINT MOBILITY TESTING:     PALPATION:  Tender to palpation in upper trap, posterior shoulder, and bicpes tendon               TODAY'S TREATMENT:                                                                                                                                         DATE:  08/13/23 Pulley 2 minutes Manual: Grade II-III inferior glides in flexion/abduction, STM to deltoid and bicep Supine shoulder flexion with cane 2 x 10 Supine shoulder AAROM ER with cane 2 x 10  Seated shoulder ER 2 x 10 Standing row RTB 2 x 10 Standing shoulder extension RTB 2 x 10 Standing bilateral shoulder ER YTB 2 x 10  07/29/23 - Seated Upper Trapezius Stretch  - 1 x daily - 7 x weekly - 3 sets - 10 reps - Theracane Over Shoulder  - 1 x daily - 7 x weekly - 3 sets - 10 reps - Seated Shoulder External Rotation  - 1 x daily - 7 x weekly - 3 sets - 10 reps - Standing Bilateral Low Shoulder Row with Anchored Resistance  - 1 x daily - 7 x weekly - 3 sets - 10 reps  Reviewed self soft tissue  mobilization    PATIENT EDUCATION:  Education details: Patient educated on exam findings, POC, scope of PT, HEP, . 08/13/23: HEP Person educated: Patient Education method: Programmer, multimedia, Demonstration, and Handouts Education comprehension: verbalized understanding, returned demonstration, verbal cues required, and tactile cues required   HOME EXERCISE PROGRAM: Access Code: [email or web address] URL: [email or web address] Date: 07/30/2023 Prepared by: Lorayne Bender    ASSESSMENT:   CLINICAL IMPRESSION: Patient demonstrating improving ROM but some symptoms at end range positioning. Continued with shoulder mobility and strengthening which is tolerated well. Patient with tenderness in proximal medial bicep region and deltoid which began a couple of months ago but different than original problem. Patient will continue to benefit from physical therapy in order to improve function and reduce impairment.    EVAL: Patient a 52 y.o. y.o. female who was seen today for physical therapy evaluation and treatment for L shoulder and elbow pain. Patient presents with pain limited deficits in L shoulder/elbow strength, ROM, endurance, activity tolerance, and functional mobility with ADL. The patient has pain with active use of her left shoulder. Her elbow pain has resovled for the most part. We will continue to monitor. Therapy performed manual therapy to shoulder and she had some improvement with PROM.  OBJECTIVE IMPAIRMENTS: decreased activity tolerance, decreased ROM, decreased strength, impaired flexibility, improper body mechanics, and pain.    ACTIVITY LIMITATIONS: carrying, lifting, bending, bathing, dressing, reach over head, hygiene/grooming,  and caring for others   PARTICIPATION LIMITATIONS: meal prep, cleaning, laundry, shopping, community activity, occupation, and yard work   PERSONAL FACTORS: Time since onset of injury/illness/exacerbation and 1-2 comorbidities: HTN, HLD  are also affecting  patient's functional outcome.    REHAB POTENTIAL: Good   CLINICAL DECISION MAKING: Stable/uncomplicated   EVALUATION COMPLEXITY: Low     GOALS: Goals reviewed with patient? Yes   SHORT TERM GOALS: Target date: 08/27/2023     Patient will be independent with HEP in order to improve functional outcomes. Baseline: Goal status: INITIAL   2.  Patient will report at least 25% improvement in symptoms for improved quality of life. Baseline:  Goal status: INITIAL   3.  Patient will improve passive ER by 20 degrees  Baseline:  Goal status: INITIAL   4.  Patient will increase left shoulder active flexion to 140 degrees  Baseline:  Goal status: INITIAL   LONG TERM GOALS: Target date: 09/24/2023     Patient will report at least 75% improvement in symptoms for improved quality of life. Baseline:  Goal status: INITIAL   2.  Patient will improve FOTO score by at least 19 points in order to indicate improved tolerance to activity. Baseline:  Goal status: INITIAL   3.  Patient will demonstrate at least 150 in shoulder AROM in flexion for improved ability lift overhead. Baseline:  Goal status: INITIAL   4.  Patient will be able to return to all activities unrestricted for improved ability to perform work functions and participate with family.  Baseline:  Goal status: INITIAL   5.  Patient will demonstrate grade of 5/5 MMT grade in all tested musculature as evidence of improved strength to assist with lifting at home. Baseline:  Goal status: INITIAL      PLAN:   PT FREQUENCY: 1-2x/week   PT DURATION: 8 weeks   PLANNED INTERVENTIONS: 97164- PT Re-evaluation, 97110-Therapeutic exercises, 97530- Therapeutic activity, 97112- Neuromuscular re-education, 97535- Self Care, 09604- Manual therapy, [phone]- Gait training, [phone]- Orthotic Fit/training, [phone]- Canalith repositioning, [id number]- Aquatic Therapy, [phone]- Splinting, Patient/Family education, Balance training, Stair training,  Taping, Dry Needling, Joint mobilization, Joint manipulation, Spinal manipulation, Spinal mobilization, Scar mobilization, and DME instructions.     PLAN FOR NEXT SESSION: consider dry needling f upper trap and sub-scap; continue with TP release to all painful area; consider wand press and pulley next visit; add shoulder extension if tolerated.    Reola Mosher Zury Fazzino, PT 08/13/2023, 9:08 AM

## 2023-08-20 ENCOUNTER — Encounter (HOSPITAL_BASED_OUTPATIENT_CLINIC_OR_DEPARTMENT_OTHER): Payer: BC Managed Care – PPO | Admitting: Physical Therapy

## 2023-08-21 ENCOUNTER — Ambulatory Visit (HOSPITAL_BASED_OUTPATIENT_CLINIC_OR_DEPARTMENT_OTHER): Payer: 59 | Attending: Family Medicine | Admitting: Physical Therapy

## 2023-08-21 ENCOUNTER — Encounter (HOSPITAL_BASED_OUTPATIENT_CLINIC_OR_DEPARTMENT_OTHER): Payer: Self-pay | Admitting: Physical Therapy

## 2023-08-21 DIAGNOSIS — M25512 Pain in left shoulder: Secondary | ICD-10-CM | POA: Diagnosis present

## 2023-08-21 DIAGNOSIS — M25612 Stiffness of left shoulder, not elsewhere classified: Secondary | ICD-10-CM | POA: Diagnosis present

## 2023-08-21 DIAGNOSIS — M62838 Other muscle spasm: Secondary | ICD-10-CM | POA: Diagnosis present

## 2023-08-21 DIAGNOSIS — G8929 Other chronic pain: Secondary | ICD-10-CM | POA: Diagnosis present

## 2023-08-21 NOTE — Therapy (Signed)
 OUTPATIENT PHYSICAL THERAPY TREATMENT   Patient Name: Bethany Mata MRN: [id number] DOB:09/24/1970, 53 y.o., female Today's Date: 08/21/2023  END OF SESSION:  PT End of Session - 08/21/23 1046     Visit Number 3    Number of Visits 8    Date for PT Re-Evaluation 09/24/23    Authorization Type BCBS    Authorization Time Period 6 visits approved  From 12.12.2024 - 02.09.2025    Authorization - Number of Visits 6    PT Start Time 1045    PT Stop Time 1128    PT Time Calculation (min) 43 min    Activity Tolerance Patient tolerated treatment well    Behavior During Therapy [facility] for tasks assessed/performed              Past Medical History:  Diagnosis Date   Elevated cholesterol    Hypertension    Seasonal allergies    Past Surgical History:  Procedure Laterality Date   AUGMENTATION MAMMAPLASTY Bilateral 06/2016   saline implants    BREAST SURGERY     ENDOMETRIAL ABLATION  3/13   Cryoablation   GYNECOLOGIC CRYOSURGERY     REFRACTIVE SURGERY  5-12   BILATERAL   Patient Active Problem List   Diagnosis Date Noted   Cough 07/27/2014   Hypothyroidism 10/26/2012   Essential hypertension, benign 10/26/2012      PCP: None recorded    REFERRING PROVIDER: Teressa Rainell BROCKS, DO   REFERRING DIAG: [phone] (ICD-10-CM) - Pain in joint of left shoulder M25.522 (ICD-10-CM) - Left elbow pain   THERAPY DIAG:  No diagnosis found.   Rationale for Evaluation and Treatment: Rehabilitation   ONSET DATE: Summer of 2024    SUBJECTIVE:                                                                                                                                                                                       SUBJECTIVE STATEMENT: It is still just really tight, have not tried dry needling before  EVAL: In August the patient was gardening and she felt a pull in her shoulder.  Since that point she has had significant pain in her shoulder.  She also had a period of time where  her elbow hurt.  She feels like that is resolved.  She continues to have pain at night when she lies on the shoulder.  She feels like when she has to hold her arm up and perform activities her shoulder becomes more painful.  She has pain in left front and back of her shoulder.   Hand dominance: Right   PERTINENT HISTORY: HTN, HLD   PAIN:  Are you having pain? Yes: NPRS scale: 5/10 at worst over the past week  Pain location: in the front and can go down the side; has a spot in the back of the shoulder  Pain description: aching  Aggravating factors: use of the arm  Relieving factors: heat sometimes; voltarin    PRECAUTIONS: None   RED FLAGS: None       WEIGHT BEARING RESTRICTIONS: No   FALLS:  Has patient fallen in last 6 months? 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, Broadcasting: gardening; reading     PLOF: Independent   PATIENT GOALS: To have less pain    OBJECTIVE: (objective measures from initial evaluation unless otherwise dated)     PATIENT SURVEYS:  FOTO 47% initial 66% expected in 10 min    COGNITION: Overall cognitive status: Within functional limits for tasks assessed                                     SENSATION: WFL   POSTURE: No Significant postural limitations    UPPER EXTREMITY ROM:    Active ROM Right eval Left eval  Shoulder flexion   108    Shoulder extension      Shoulder abduction      Shoulder adduction      Shoulder internal rotation   To buttock with pain    Shoulder external rotation    painful to reach her head   Elbow flexion      Elbow extension      Wrist flexion      Wrist extension      Wrist ulnar deviation      Wrist radial deviation      Wrist pronation      Wrist supination      (Blank rows = not tested) *=pain/symptoms  Active ROM Right eval Left eval Left 08/13/23  Shoulder flexion   132  AAROM 151  Shoulder extension       Shoulder abduction       Shoulder adduction       Shoulder  internal rotation   67    Shoulder external rotation    45    Elbow flexion       Elbow extension       Wrist flexion       Wrist extension       Wrist ulnar deviation       Wrist radial deviation       Wrist pronation       Wrist supination       (Blank rows = not tested) *=pain/symptoms   UPPER EXTREMITY MMT:   MMT Right eval Left eval  Shoulder flexion      Shoulder extension      Shoulder abduction      Shoulder adduction      Shoulder internal rotation      Shoulder external rotation      Middle trapezius      Lower trapezius      Elbow flexion      Elbow extension      Wrist flexion      Wrist extension      Wrist ulnar deviation      Wrist radial deviation      Wrist pronation      Wrist supination      Grip strength (lbs)      (  Blank rows = not tested) *=pain/symptoms Not tested 2nd to pain        JOINT MOBILITY TESTING:     PALPATION:  Tender to palpation in upper trap, posterior shoulder, and bicpes tendon               TODAY'S TREATMENT:                                                                                                                                         Treatment                            1/4:  Trigger Point Dry Needling  Initial Treatment: Pt instructed on Dry Needling rational, procedures, and possible side effects. Pt instructed to expect mild to moderate muscle soreness later in the day and/or into the next day.  Pt instructed in methods to reduce muscle soreness. Pt instructed to continue prescribed HEP. Because Dry Needling was performed over or adjacent to a lung field, pt was educated on S/S of pneumothorax and to seek immediate medical attention should they occur.  Patient was educated on signs and symptoms of infection and other risk factors and advised to seek medical attention should they occur.  Patient verbalized understanding of these instructions and education.   Patient Verbal Consent Given: Yes Education  Handout Provided: Yes Muscles Treated: bilateral upper traps, bil levator scap, Rt cervical paraspinals, bilat suboccipitals Electrical Stimulation Performed: No Treatment Response/Outcome: decreased spasm with expected soreness reported.   Manual cervical distraction, +SB, + rotation, C0 on 1 AP mobs, suboccipital release Supine chin tucks First rib mobs with sheet Open book Seated postural alignment Bra wear   DATE:  08/13/23 Pulley 2 minutes Manual: Grade II-III inferior glides in flexion/abduction, STM to deltoid and bicep Supine shoulder flexion with cane 2 x 10 Supine shoulder AAROM ER with cane 2 x 10  Seated shoulder ER 2 x 10 Standing row RTB 2 x 10 Standing shoulder extension RTB 2 x 10 Standing bilateral shoulder ER YTB 2 x 10  07/29/23 - Seated Upper Trapezius Stretch  - 1 x daily - 7 x weekly - 3 sets - 10 reps - Theracane Over Shoulder  - 1 x daily - 7 x weekly - 3 sets - 10 reps - Seated Shoulder External Rotation  - 1 x daily - 7 x weekly - 3 sets - 10 reps - Standing Bilateral Low Shoulder Row with Anchored Resistance  - 1 x daily - 7 x weekly - 3 sets - 10 reps  Reviewed self soft tissue mobilization    PATIENT EDUCATION:  Education details: Patient educated on exam findings, POC, scope of PT, HEP, . 08/13/23: HEP Person educated: Patient Education method: Programmer, Multimedia, Demonstration, and Handouts Education comprehension: verbalized understanding, returned demonstration, verbal cues required, and tactile cues required   HOME  EXERCISE PROGRAM: Access Code: [email or web address] URL: [email or web address] Date: 07/30/2023 Prepared by: Alm Don    ASSESSMENT:   CLINICAL IMPRESSION: Dry needling utilized today in order to decrease spasm through cervical region.  Discussed bra wear as a wide strap and brace her back to improve the dispersing of pressure on shoulders.  Will work on maintaining a stacked posture especially when working on her computer.   Will continue dry needling as appropriate.    EVAL: Patient a 53 y.o. y.o. female who was seen today for physical therapy evaluation and treatment for L shoulder and elbow pain. Patient presents with pain limited deficits in L shoulder/elbow strength, ROM, endurance, activity tolerance, and functional mobility with ADL. The patient has pain with active use of her left shoulder. Her elbow pain has resovled for the most part. We will continue to monitor. Therapy performed manual therapy to shoulder and she had some improvement with PROM.  OBJECTIVE IMPAIRMENTS: decreased activity tolerance, decreased ROM, decreased strength, impaired flexibility, improper body mechanics, and pain.    ACTIVITY LIMITATIONS: carrying, lifting, bending, bathing, dressing, reach over head, hygiene/grooming, and caring for others   PARTICIPATION LIMITATIONS: meal prep, cleaning, laundry, shopping, community activity, occupation, and yard work   PERSONAL FACTORS: Time since onset of injury/illness/exacerbation and 1-2 comorbidities: HTN, HLD  are also affecting patient's functional outcome.    REHAB POTENTIAL: Good   CLINICAL DECISION MAKING: Stable/uncomplicated   EVALUATION COMPLEXITY: Low     GOALS: Goals reviewed with patient? Yes   SHORT TERM GOALS: Target date: 08/27/2023     Patient will be independent with HEP in order to improve functional outcomes. Baseline: Goal status: INITIAL   2.  Patient will report at least 25% improvement in symptoms for improved quality of life. Baseline:  Goal status: INITIAL   3.  Patient will improve passive ER by 20 degrees  Baseline:  Goal status: INITIAL   4.  Patient will increase left shoulder active flexion to 140 degrees  Baseline:  Goal status: INITIAL   LONG TERM GOALS: Target date: 09/24/2023     Patient will report at least 75% improvement in symptoms for improved quality of life. Baseline:  Goal status: INITIAL   2.  Patient will improve FOTO  score by at least 19 points in order to indicate improved tolerance to activity. Baseline:  Goal status: INITIAL   3.  Patient will demonstrate at least 150 in shoulder AROM in flexion for improved ability lift overhead. Baseline:  Goal status: INITIAL   4.  Patient will be able to return to all activities unrestricted for improved ability to perform work functions and participate with family.  Baseline:  Goal status: INITIAL   5.  Patient will demonstrate grade of 5/5 MMT grade in all tested musculature as evidence of improved strength to assist with lifting at home. Baseline:  Goal status: INITIAL      PLAN:   PT FREQUENCY: 1-2x/week   PT DURATION: 8 weeks   PLANNED INTERVENTIONS: 97164- PT Re-evaluation, 97110-Therapeutic exercises, 97530- Therapeutic activity, 97112- Neuromuscular re-education, 97535- Self Care, 02859- Manual therapy, [phone]- Gait training, [phone]- Orthotic Fit/training, [phone]- Canalith repositioning, [id number]- Aquatic Therapy, [phone]- Splinting, Patient/Family education, Balance training, Stair training, Taping, Dry Needling, Joint mobilization, Joint manipulation, Spinal manipulation, Spinal mobilization, Scar mobilization, and DME instructions.     PLAN FOR NEXT SESSION: consider dry needling f upper trap and sub-scap; continue with TP release to all painful area; consider wand press and pulley next visit; add shoulder extension  if tolerated.    Yandriel Boening C. Juanmiguel Defelice PT, DPT 08/21/23 12:21 PM

## 2023-08-24 ENCOUNTER — Encounter (HOSPITAL_BASED_OUTPATIENT_CLINIC_OR_DEPARTMENT_OTHER): Payer: BC Managed Care – PPO | Admitting: Physical Therapy

## 2023-08-27 ENCOUNTER — Ambulatory Visit (HOSPITAL_BASED_OUTPATIENT_CLINIC_OR_DEPARTMENT_OTHER): Payer: 59 | Admitting: Physical Therapy

## 2023-08-27 ENCOUNTER — Encounter (HOSPITAL_BASED_OUTPATIENT_CLINIC_OR_DEPARTMENT_OTHER): Payer: Self-pay | Admitting: Physical Therapy

## 2023-08-27 DIAGNOSIS — M25612 Stiffness of left shoulder, not elsewhere classified: Secondary | ICD-10-CM

## 2023-08-27 DIAGNOSIS — G8929 Other chronic pain: Secondary | ICD-10-CM

## 2023-08-27 DIAGNOSIS — M62838 Other muscle spasm: Secondary | ICD-10-CM

## 2023-08-27 DIAGNOSIS — M25512 Pain in left shoulder: Secondary | ICD-10-CM | POA: Diagnosis not present

## 2023-08-27 NOTE — Therapy (Signed)
 OUTPATIENT PHYSICAL THERAPY TREATMENT   Patient Name: Bethany Mata MRN: [id number] DOB:1971/06/10, 53 y.o., female Today's Date: 08/27/2023  END OF SESSION:  PT End of Session - 08/27/23 1108     Visit Number 4    Number of Visits 8    Date for PT Re-Evaluation 09/24/23    Authorization Type BCBS    Authorization Time Period 6 visits approved  From 12.12.2024 - 02.09.2025    PT Start Time 1100    PT Stop Time 1142    PT Time Calculation (min) 42 min    Activity Tolerance Patient tolerated treatment well    Behavior During Therapy [facility] for tasks assessed/performed              Past Medical History:  Diagnosis Date   Elevated cholesterol    Hypertension    Seasonal allergies    Past Surgical History:  Procedure Laterality Date   AUGMENTATION MAMMAPLASTY Bilateral 06/2016   saline implants    BREAST SURGERY     ENDOMETRIAL ABLATION  3/13   Cryoablation   GYNECOLOGIC CRYOSURGERY     REFRACTIVE SURGERY  5-12   BILATERAL   Patient Active Problem List   Diagnosis Date Noted   Cough 07/27/2014   Hypothyroidism 10/26/2012   Essential hypertension, benign 10/26/2012      PCP: None recorded    REFERRING PROVIDER: Teressa Rainell BROCKS, DO   REFERRING DIAG: [phone] (ICD-10-CM) - Pain in joint of left shoulder M25.522 (ICD-10-CM) - Left elbow pain   THERAPY DIAG:  No diagnosis found.   Rationale for Evaluation and Treatment: Rehabilitation   ONSET DATE: Summer of 2024    SUBJECTIVE:                                                                                                                                                                                       SUBJECTIVE STATEMENT: Patient feels like the dry needling helped. She has a tight feeling. She continues to have elbow pain when she is perfroming work activity.   EVAL: In August the patient was gardening and she felt a pull in her shoulder.  Since that point she has had significant pain in her shoulder.   She also had a period of time where her elbow hurt.  She feels like that is resolved.  She continues to have pain at night when she lies on the shoulder.  She feels like when she has to hold her arm up and perform activities her shoulder becomes more painful.  She has pain in left front and back of her shoulder.   Hand dominance: Right   PERTINENT HISTORY: HTN,  HLD   PAIN:  Are you having pain? Yes: NPRS scale: 5/10 at worst over the past week  Pain location: in the front and can go down the side; has a spot in the back of the shoulder  Pain description: aching  Aggravating factors: use of the arm  Relieving factors: heat sometimes; voltarin    PRECAUTIONS: None   RED FLAGS: None       WEIGHT BEARING RESTRICTIONS: No   FALLS:  Has patient fallen in last 6 months? 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, Broadcasting: gardening; reading     PLOF: Independent   PATIENT GOALS: To have less pain    OBJECTIVE: (objective measures from initial evaluation unless otherwise dated)     PATIENT SURVEYS:  FOTO 47% initial 66% expected in 10 min    COGNITION: Overall cognitive status: Within functional limits for tasks assessed                                     SENSATION: WFL   POSTURE: No Significant postural limitations    UPPER EXTREMITY ROM:    Active ROM Right eval Left eval  Shoulder flexion   108    Shoulder extension      Shoulder abduction      Shoulder adduction      Shoulder internal rotation   To buttock with pain    Shoulder external rotation    painful to reach her head   Elbow flexion      Elbow extension      Wrist flexion      Wrist extension      Wrist ulnar deviation      Wrist radial deviation      Wrist pronation      Wrist supination      (Blank rows = not tested) *=pain/symptoms  Active ROM Right eval Left eval Left 08/13/23  Shoulder flexion   132  AAROM 151  Shoulder extension       Shoulder abduction        Shoulder adduction       Shoulder internal rotation   67    Shoulder external rotation    45    Elbow flexion       Elbow extension       Wrist flexion       Wrist extension       Wrist ulnar deviation       Wrist radial deviation       Wrist pronation       Wrist supination       (Blank rows = not tested) *=pain/symptoms   UPPER EXTREMITY MMT:   MMT Right eval Left eval  Shoulder flexion      Shoulder extension      Shoulder abduction      Shoulder adduction      Shoulder internal rotation      Shoulder external rotation      Middle trapezius      Lower trapezius      Elbow flexion      Elbow extension      Wrist flexion      Wrist extension      Wrist ulnar deviation      Wrist radial deviation      Wrist pronation      Wrist supination  Grip strength (lbs)      (Blank rows = not tested) *=pain/symptoms Not tested 2nd to pain        JOINT MOBILITY TESTING:     PALPATION:  Tender to palpation in upper trap, posterior shoulder, and bicpes tendon               TODAY'S TREATMENT:                                                                                                                                         Treatment                            1/4:  Trigger Point Dry Needling  Initial Treatment: Pt instructed on Dry Needling rational, procedures, and possible side effects. Pt instructed to expect mild to moderate muscle soreness later in the day and/or into the next day.  Pt instructed in methods to reduce muscle soreness. Pt instructed to continue prescribed HEP. Because Dry Needling was performed over or adjacent to a lung field, pt was educated on S/S of pneumothorax and to seek immediate medical attention should they occur.  Patient was educated on signs and symptoms of infection and other risk factors and advised to seek medical attention should they occur.  Patient verbalized understanding of these instructions and education.   Patient Verbal  Consent Given: Yes Education Handout Provided: Yes Muscles Treated: bilateral upper traps, bil levator scap, Rt cervical paraspinals, bilat suboccipitals Electrical Stimulation Performed: No Treatment Response/Outcome: decreased spasm with expected soreness reported.   Manual: trigger point release to sub-ociptials, levator, and upper trap; review of self soft tissue mobilization to elbow and upper trap.   Putty:  Key grip 2x10  Gross grip 2x10  Finger tips 2x10   Pronation/supination 2 lbs 2x15 left   DATE:  08/13/23 Pulley 2 minutes Manual: Grade II-III inferior glides in flexion/abduction, STM to deltoid and bicep Supine shoulder flexion with cane 2 x 10 Supine shoulder AAROM ER with cane 2 x 10  Seated shoulder ER 2 x 10 Standing row RTB 2 x 10 Standing shoulder extension RTB 2 x 10 Standing bilateral shoulder ER YTB 2 x 10  07/29/23 - Seated Upper Trapezius Stretch  - 1 x daily - 7 x weekly - 3 sets - 10 reps - Theracane Over Shoulder  - 1 x daily - 7 x weekly - 3 sets - 10 reps - Seated Shoulder External Rotation  - 1 x daily - 7 x weekly - 3 sets - 10 reps - Standing Bilateral Low Shoulder Row with Anchored Resistance  - 1 x daily - 7 x weekly - 3 sets - 10 reps  Reviewed self soft tissue mobilization    PATIENT EDUCATION:  Education details: Patient educated on exam findings, POC, scope of PT, HEP, . 08/13/23: HEP Person educated:  Patient Education method: Explanation, Demonstration, and Handouts Education comprehension: verbalized understanding, returned demonstration, verbal cues required, and tactile cues required   HOME EXERCISE PROGRAM: Access Code: [email or web address] URL: [email or web address] Date: 07/30/2023 Prepared by: Alm Don    ASSESSMENT:   CLINICAL IMPRESSION: Tehrapy perfromed dry needling on the patient today. She had a great twitch. She continues to have pain with activity of her left elbow. We gave her left elbow strengthening  today and reviewed how to perform self soft tissue mobilization to her elbow. We will continue to progress as tolerated.   EVAL: Patient a 53 y.o. y.o. female who was seen today for physical therapy evaluation and treatment for L shoulder and elbow pain. Patient presents with pain limited deficits in L shoulder/elbow strength, ROM, endurance, activity tolerance, and functional mobility with ADL. The patient has pain with active use of her left shoulder. Her elbow pain has resovled for the most part. We will continue to monitor. Therapy performed manual therapy to shoulder and she had some improvement with PROM.  OBJECTIVE IMPAIRMENTS: decreased activity tolerance, decreased ROM, decreased strength, impaired flexibility, improper body mechanics, and pain.    ACTIVITY LIMITATIONS: carrying, lifting, bending, bathing, dressing, reach over head, hygiene/grooming, and caring for others   PARTICIPATION LIMITATIONS: meal prep, cleaning, laundry, shopping, community activity, occupation, and yard work   PERSONAL FACTORS: Time since onset of injury/illness/exacerbation and 1-2 comorbidities: HTN, HLD  are also affecting patient's functional outcome.    REHAB POTENTIAL: Good   CLINICAL DECISION MAKING: Stable/uncomplicated   EVALUATION COMPLEXITY: Low     GOALS: Goals reviewed with patient? Yes   SHORT TERM GOALS: Target date: 08/27/2023     Patient will be independent with HEP in order to improve functional outcomes. Baseline: Goal status: INITIAL   2.  Patient will report at least 25% improvement in symptoms for improved quality of life. Baseline:  Goal status: INITIAL   3.  Patient will improve passive ER by 20 degrees  Baseline:  Goal status: INITIAL   4.  Patient will increase left shoulder active flexion to 140 degrees  Baseline:  Goal status: INITIAL   LONG TERM GOALS: Target date: 09/24/2023     Patient will report at least 75% improvement in symptoms for improved quality of  life. Baseline:  Goal status: INITIAL   2.  Patient will improve FOTO score by at least 19 points in order to indicate improved tolerance to activity. Baseline:  Goal status: INITIAL   3.  Patient will demonstrate at least 150 in shoulder AROM in flexion for improved ability lift overhead. Baseline:  Goal status: INITIAL   4.  Patient will be able to return to all activities unrestricted for improved ability to perform work functions and participate with family.  Baseline:  Goal status: INITIAL   5.  Patient will demonstrate grade of 5/5 MMT grade in all tested musculature as evidence of improved strength to assist with lifting at home. Baseline:  Goal status: INITIAL      PLAN:   PT FREQUENCY: 1-2x/week   PT DURATION: 8 weeks   PLANNED INTERVENTIONS: 97164- PT Re-evaluation, 97110-Therapeutic exercises, 97530- Therapeutic activity, 97112- Neuromuscular re-education, 97535- Self Care, 02859- Manual therapy, [phone]- Gait training, [phone]- Orthotic Fit/training, [phone]- Canalith repositioning, [id number]- Aquatic Therapy, [phone]- Splinting, Patient/Family education, Balance training, Stair training, Taping, Dry Needling, Joint mobilization, Joint manipulation, Spinal manipulation, Spinal mobilization, Scar mobilization, and DME instructions.     PLAN FOR NEXT SESSION: consider dry needling f upper trap and  sub-scap; continue with TP release to all painful area; consider wand press and pulley next visit; add shoulder extension if tolerated.   Alm Don PT DPT 08/27/23 1:27 PM

## 2023-08-29 ENCOUNTER — Encounter (HOSPITAL_BASED_OUTPATIENT_CLINIC_OR_DEPARTMENT_OTHER): Payer: Self-pay | Admitting: Physical Therapy

## 2023-09-02 ENCOUNTER — Other Ambulatory Visit: Payer: Self-pay | Admitting: Nurse Practitioner

## 2023-09-02 DIAGNOSIS — Z79890 Hormone replacement therapy (postmenopausal): Secondary | ICD-10-CM

## 2023-09-02 NOTE — Telephone Encounter (Signed)
Med refill request: progesterone 100 mg Last AEX: 09/22/22 Next AEX: not scheduled Last MMG (if hormonal med) 09/2022 per pt, 08/30/17 Breast Density Cat C, BI-RADS CAT 1 neg  Refill authorized: vivelle-dot, #[id number], 0 RF, asked front to schedule AEX

## 2023-09-03 ENCOUNTER — Encounter (HOSPITAL_BASED_OUTPATIENT_CLINIC_OR_DEPARTMENT_OTHER): Payer: Self-pay | Admitting: Physical Therapy

## 2023-09-03 ENCOUNTER — Ambulatory Visit (HOSPITAL_BASED_OUTPATIENT_CLINIC_OR_DEPARTMENT_OTHER): Payer: 59 | Admitting: Physical Therapy

## 2023-09-03 DIAGNOSIS — G8929 Other chronic pain: Secondary | ICD-10-CM

## 2023-09-03 DIAGNOSIS — M62838 Other muscle spasm: Secondary | ICD-10-CM

## 2023-09-03 DIAGNOSIS — M25612 Stiffness of left shoulder, not elsewhere classified: Secondary | ICD-10-CM

## 2023-09-03 DIAGNOSIS — M25512 Pain in left shoulder: Secondary | ICD-10-CM | POA: Diagnosis not present

## 2023-09-03 NOTE — Therapy (Signed)
OUTPATIENT PHYSICAL THERAPY TREATMENT   Patient Name: Bethany Mata MRN: [id number] DOB:March 27, 1971, 53 y.o., female Today's Date: 09/03/2023  END OF SESSION:  PT End of Session - 09/03/23 1518     Visit Number 5    Number of Visits 8    Date for PT Re-Evaluation 09/24/23    Authorization Type BCBS    Authorization Time Period 6 visits approved  From 12.12.2024 - 02.09.2025    PT Start Time 1515    PT Stop Time 1600    PT Time Calculation (min) 45 min    Activity Tolerance Patient tolerated treatment well    Behavior During Therapy [facility] for tasks assessed/performed               Past Medical History:  Diagnosis Date   Elevated cholesterol    Hypertension    Seasonal allergies    Past Surgical History:  Procedure Laterality Date   AUGMENTATION MAMMAPLASTY Bilateral 06/2016   saline implants    BREAST SURGERY     ENDOMETRIAL ABLATION  3/13   Cryoablation   GYNECOLOGIC CRYOSURGERY     REFRACTIVE SURGERY  5-12   BILATERAL   Patient Active Problem List   Diagnosis Date Noted   Cough 07/27/2014   Hypothyroidism 10/26/2012   Essential hypertension, benign 10/26/2012      PCP: None recorded    REFERRING PROVIDER: Andi Devon, DO   REFERRING DIAG: [phone] (ICD-10-CM) - Pain in joint of left shoulder M25.522 (ICD-10-CM) - Left elbow pain   THERAPY DIAG:  No diagnosis found.   Rationale for Evaluation and Treatment: Rehabilitation   ONSET DATE: Summer of 2024    SUBJECTIVE:                                                                                                                                                                                       SUBJECTIVE STATEMENT: The patient cotinines to improve. She is now just having a pinching in her posterior and anterior shoulder with activity. She has bene working on her elbow exercises. It has helped.   EVAL: In August the patient was gardening and she felt a pull in her shoulder.  Since that point  she has had significant pain in her shoulder.  She also had a period of time where her elbow hurt.  She feels like that is resolved.  She continues to have pain at night when she lies on the shoulder.  She feels like when she has to hold her arm up and perform activities her shoulder becomes more painful.  She has pain in left front and back of her shoulder.  Hand dominance: Right   PERTINENT HISTORY: HTN, HLD   PAIN:  Are you having pain? Yes: NPRS scale: 5/10 at worst over the past week  Pain location: in the front and can go down the side; has a spot in the back of the shoulder  Pain description: aching  Aggravating factors: use of the arm  Relieving factors: heat sometimes; voltarin    PRECAUTIONS: None   RED FLAGS: None       WEIGHT BEARING RESTRICTIONS: No   FALLS:  Has patient fallen in last 6 months? 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, broadcasting: gardening; reading     PLOF: Independent   PATIENT GOALS: To have less pain    OBJECTIVE: (objective measures from initial evaluation unless otherwise dated)     PATIENT SURVEYS:  FOTO 47% initial 66% expected in 10 min    COGNITION: Overall cognitive status: Within functional limits for tasks assessed                                     SENSATION: WFL   POSTURE: No Significant postural limitations    UPPER EXTREMITY ROM:    Active ROM Right eval Left eval  Shoulder flexion   108    Shoulder extension      Shoulder abduction      Shoulder adduction      Shoulder internal rotation   To buttock with pain    Shoulder external rotation    painful to reach her head   Elbow flexion      Elbow extension      Wrist flexion      Wrist extension      Wrist ulnar deviation      Wrist radial deviation      Wrist pronation      Wrist supination      (Blank rows = not tested) *=pain/symptoms  Active ROM Right eval Left eval Left 08/13/23  Shoulder flexion   132  AAROM 151   Shoulder extension       Shoulder abduction       Shoulder adduction       Shoulder internal rotation   67    Shoulder external rotation    45    Elbow flexion       Elbow extension       Wrist flexion       Wrist extension       Wrist ulnar deviation       Wrist radial deviation       Wrist pronation       Wrist supination       (Blank rows = not tested) *=pain/symptoms   UPPER EXTREMITY MMT:   MMT Right eval Left eval  Shoulder flexion      Shoulder extension      Shoulder abduction      Shoulder adduction      Shoulder internal rotation      Shoulder external rotation      Middle trapezius      Lower trapezius      Elbow flexion      Elbow extension      Wrist flexion      Wrist extension      Wrist ulnar deviation      Wrist radial deviation      Wrist pronation  Wrist supination      Grip strength (lbs)      (Blank rows = not tested) *=pain/symptoms Not tested 2nd to pain        JOINT MOBILITY TESTING:     PALPATION:  Tender to palpation in upper trap, posterior shoulder, and bicpes tendon               TODAY'S TREATMENT:                                                                                                                                         1/17 Trigger Point Dry Needling  Initial Treatment: Pt instructed on Dry Needling rational, procedures, and possible side effects. Pt instructed to expect mild to moderate muscle soreness later in the day and/or into the next day.  Pt instructed in methods to reduce muscle soreness. Pt instructed to continue prescribed HEP. Because Dry Needling was performed over or adjacent to a lung field, pt was educated on S/S of pneumothorax and to seek immediate medical attention should they occur.  Patient was educated on signs and symptoms of infection and other risk factors and advised to seek medical attention should they occur.  Patient verbalized understanding of these instructions and education.    Patient Verbal Consent Given: Yes Education Handout Provided: Yes Muscles Treated: left upper trap 2x ; left sub scap 2x bilateral ECRB 2x bilateral with a .30x50 needle Electrical Stimulation Performed: No Treatment Response/Outcome: decreased spasm with expected soreness reported.   Manual: trigger point release to sub-ociptials, levator, and upper trap; review of self soft tissue mobilization to elbow and upper trap.     Treatment                            1/4:  Trigger Point Dry Needling  Initial Treatment: Pt instructed on Dry Needling rational, procedures, and possible side effects. Pt instructed to expect mild to moderate muscle soreness later in the day and/or into the next day.  Pt instructed in methods to reduce muscle soreness. Pt instructed to continue prescribed HEP. Because Dry Needling was performed over or adjacent to a lung field, pt was educated on S/S of pneumothorax and to seek immediate medical attention should they occur.  Patient was educated on signs and symptoms of infection and other risk factors and advised to seek medical attention should they occur.  Patient verbalized understanding of these instructions and education.   Patient Verbal Consent Given: Yes Education Handout Provided: Yes Muscles Treated: bilateral upper traps, bil levator scap, Rt cervical paraspinals, bilat suboccipitals Electrical Stimulation Performed: No Treatment Response/Outcome: decreased spasm with expected soreness reported.   Manual: trigger point release to sub-ociptials, levator, and upper trap; review of self soft tissue mobilization to elbow and upper trap.   Putty:  Key grip 2x10  Gross grip 2x10  Finger  tips 2x10   Pronation/supination 2 lbs 2x15 left   DATE:  08/13/23 Pulley 2 minutes Manual: Grade II-III inferior glides in flexion/abduction, STM to deltoid and bicep Supine shoulder flexion with cane 2 x 10 Supine shoulder AAROM ER with cane 2 x 10  Seated  shoulder ER 2 x 10 Standing row RTB 2 x 10 Standing shoulder extension RTB 2 x 10 Standing bilateral shoulder ER YTB 2 x 10  07/29/23 - Seated Upper Trapezius Stretch  - 1 x daily - 7 x weekly - 3 sets - 10 reps - Theracane Over Shoulder  - 1 x daily - 7 x weekly - 3 sets - 10 reps - Seated Shoulder External Rotation  - 1 x daily - 7 x weekly - 3 sets - 10 reps - Standing Bilateral Low Shoulder Row with Anchored Resistance  - 1 x daily - 7 x weekly - 3 sets - 10 reps  Reviewed self soft tissue mobilization    PATIENT EDUCATION:  Education details: Patient educated on exam findings, POC, scope of PT, HEP, . 08/13/23: HEP Person educated: Patient Education method: Programmer, multimedia, Demonstration, and Handouts Education comprehension: verbalized understanding, returned demonstration, verbal cues required, and tactile cues required   HOME EXERCISE PROGRAM: Access Code: [email or web address] URL: [email or web address] Date: 07/30/2023 Prepared by: Lorayne Bender    ASSESSMENT:   CLINICAL IMPRESSION: The patient had a great twitch in both elbows. She had tenderness to palpation in her ECRB. She also had a great twitch in her upper trap and sub-scap. Overall she is progressing well. She was advised to continue with her current exercises. See below for goal specific progress     EVAL: Patient a 53 y.o. y.o. female who was seen today for physical therapy evaluation and treatment for L shoulder and elbow pain. Patient presents with pain limited deficits in L shoulder/elbow strength, ROM, endurance, activity tolerance, and functional mobility with ADL. The patient has pain with active use of her left shoulder. Her elbow pain has resovled for the most part. We will continue to monitor. Therapy performed manual therapy to shoulder and she had some improvement with PROM.  OBJECTIVE IMPAIRMENTS: decreased activity tolerance, decreased ROM, decreased strength, impaired flexibility, improper body  mechanics, and pain.    ACTIVITY LIMITATIONS: carrying, lifting, bending, bathing, dressing, reach over head, hygiene/grooming, and caring for others   PARTICIPATION LIMITATIONS: meal prep, cleaning, laundry, shopping, community activity, occupation, and yard work   PERSONAL FACTORS: Time since onset of injury/illness/exacerbation and 1-2 comorbidities: HTN, HLD  are also affecting patient's functional outcome.    REHAB POTENTIAL: Good   CLINICAL DECISION MAKING: Stable/uncomplicated   EVALUATION COMPLEXITY: Low     GOALS: Goals reviewed with patient? Yes   SHORT TERM GOALS: Target date: 08/27/2023     Patient will be independent with HEP in order to improve functional outcomes. Baseline: Goal status: progressing 1/17   2.  Patient will report at least 25% improvement in symptoms for improved quality of life. Baseline:  Goal status: achived 1/17   3.  Patient will improve passive ER by 20 degrees  Baseline:  Goal status: achieved 1/17   4.  Patient will increase left shoulder active flexion to 140 degrees  Baseline:  Goal status: INITIAL   LONG TERM GOALS: Target date: 09/24/2023     Patient will report at least 75% improvement in symptoms for improved quality of life. Baseline:  Goal status: INITIAL   2.  Patient will improve FOTO score by at least  19 points in order to indicate improved tolerance to activity. Baseline:  Goal status: INITIAL   3.  Patient will demonstrate at least 150 in shoulder AROM in flexion for improved ability lift overhead. Baseline:  Goal status: INITIAL   4.  Patient will be able to return to all activities unrestricted for improved ability to perform work functions and participate with family.  Baseline:  Goal status: INITIAL   5.  Patient will demonstrate grade of 5/5 MMT grade in all tested musculature as evidence of improved strength to assist with lifting at home. Baseline:  Goal status: INITIAL      PLAN:   PT FREQUENCY:  1-2x/week   PT DURATION: 8 weeks   PLANNED INTERVENTIONS: 97164- PT Re-evaluation, 97110-Therapeutic exercises, 97530- Therapeutic activity, 97112- Neuromuscular re-education, 97535- Self Care, [id number]- Manual therapy, [phone]- Gait training, [phone]- Orthotic Fit/training, [phone]- Canalith repositioning, [id number]- Aquatic Therapy, [phone]- Splinting, Patient/Family education, Balance training, Stair training, Taping, Dry Needling, Joint mobilization, Joint manipulation, Spinal manipulation, Spinal mobilization, Scar mobilization, and DME instructions.     PLAN FOR NEXT SESSION: consider dry needling f upper trap and sub-scap; continue with TP release to all painful area; consider wand press and pulley next visit; add shoulder extension if tolerated.   Lorayne Bender PT DPT 09/03/23 4:17 PM

## 2023-09-05 NOTE — Therapy (Signed)
OUTPATIENT PHYSICAL THERAPY TREATMENT   Patient Name: Bethany Mata MRN: [id number] DOB:1970-12-03, 53 y.o., female Today's Date: 09/07/2023  END OF SESSION:  PT End of Session - 09/06/23 1541     Visit Number 6    Number of Visits 8    Date for PT Re-Evaluation 09/24/23    Authorization Type BC[employer]    PT Start Time 1538    PT Stop Time 1623    PT Time Calculation (min) 45 min    Activity Tolerance Patient tolerated treatment well    Behavior During Therapy [facility] for tasks assessed/performed                Past Medical History:  Diagnosis Date   Elevated cholesterol    Hypertension    Seasonal allergies    Past Surgical History:  Procedure Laterality Date   AUGMENTATION MAMMAPLASTY Bilateral 06/2016   saline implants    BREAST SURGERY     ENDOMETRIAL ABLATION  3/13   Cryoablation   GYNECOLOGIC CRYOSURGERY     REFRACTIVE SURGERY  5-12   BILATERAL   Patient Active Problem List   Diagnosis Date Noted   Cough 07/27/2014   Hypothyroidism 10/26/2012   Essential hypertension, benign 10/26/2012      PCP: None recorded    REFERRING PROVIDER: Andi Devon, DO   REFERRING DIAG: [phone] (ICD-10-CM) - Pain in joint of left shoulder M25.522 (ICD-10-CM) - Left elbow pain   THERAPY DIAG:  No diagnosis found.   Rationale for Evaluation and Treatment: Rehabilitation   ONSET DATE: Summer of 2024    SUBJECTIVE:                                                                                                                                                                                       SUBJECTIVE STATEMENT: Pt reports improved pain in L shoulder since beginning PT.  Pt states her neck hasn't been as bad since the dry needling.  Pt may have overdone it at home with cleaning, organizing, and laundry.  She had increased L shoulder pain and difficulty sleeping last night.  Pt reports her neck has also been tight since overdoing it.   She still has pain with certain  motions including donning sweatshirt, reaching out to close the door, and reaching behind the back.   Pt denies any adverse effects after prior Rx and did have some soreness.    EVAL: In August the patient was gardening and she felt a pull in her shoulder.  Since that point she has had significant pain in her shoulder.  She also had a period of time where her elbow hurt.  She feels like that is resolved.  She continues to have pain at night when she lies on the shoulder.  She feels like when she has to hold her arm up and perform activities her shoulder becomes more painful.  She has pain in left front and back of her shoulder.   Hand dominance: Right   PERTINENT HISTORY: HTN, HLD   PAIN:  Are you having pain? Yes: NPRS scale: 3/10  Pain location: L shoulder Pain description: aching  Aggravating factors: use of the arm  Relieving factors: heat sometimes; voltarin    PRECAUTIONS: None   RED FLAGS: None       WEIGHT BEARING RESTRICTIONS: No   FALLS:  Has patient fallen in last 6 months? 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, broadcasting: gardening; reading     PLOF: Independent   PATIENT GOALS: To have less pain    OBJECTIVE: (objective measures from initial evaluation unless otherwise dated)     PATIENT SURVEYS:  FOTO 47% initial 66% expected in 10 min    COGNITION: Overall cognitive status: Within functional limits for tasks assessed                                     SENSATION: WFL   POSTURE: No Significant postural limitations    UPPER EXTREMITY ROM:    Active ROM Right eval Left eval  Shoulder flexion   108    Shoulder extension      Shoulder abduction      Shoulder adduction      Shoulder internal rotation   To buttock with pain    Shoulder external rotation    painful to reach her head   Elbow flexion      Elbow extension      Wrist flexion      Wrist extension      Wrist ulnar deviation      Wrist radial deviation       Wrist pronation      Wrist supination      (Blank rows = not tested) *=pain/symptoms  Active ROM Right eval Left eval Left 08/13/23  Shoulder flexion   132  AAROM 151  Shoulder extension       Shoulder abduction       Shoulder adduction       Shoulder internal rotation   67    Shoulder external rotation    45    Elbow flexion       Elbow extension       Wrist flexion       Wrist extension       Wrist ulnar deviation       Wrist radial deviation       Wrist pronation       Wrist supination       (Blank rows = not tested) *=pain/symptoms   UPPER EXTREMITY MMT:   MMT Right eval Left eval  Shoulder flexion      Shoulder extension      Shoulder abduction      Shoulder adduction      Shoulder internal rotation      Shoulder external rotation      Middle trapezius      Lower trapezius      Elbow flexion      Elbow extension      Wrist flexion  Wrist extension      Wrist ulnar deviation      Wrist radial deviation      Wrist pronation      Wrist supination      Grip strength (lbs)      (Blank rows = not tested) *=pain/symptoms Not tested 2nd to pain        JOINT MOBILITY TESTING:     PALPATION:  Tender to palpation in upper trap, posterior shoulder, and bicpes tendon               TODAY'S TREATMENT:                                                                                                                                         09/06/23  Manual: Grade II-III post and inferior glides in abduction f/b L shoulder flexion, scaption, ER, and IR PROM in supine per pt tolerance. STM to bilat UT  Supine wand flexion x10 reps Supine wand ER 2x10 Supine serratus punch 2x10 Standing rows with retraction x10 with RTB and x 10 with GTB    1/17 Trigger Point Dry Needling  Initial Treatment: Pt instructed on Dry Needling rational, procedures, and possible side effects. Pt instructed to expect mild to moderate muscle soreness later in the day and/or into the  next day.  Pt instructed in methods to reduce muscle soreness. Pt instructed to continue prescribed HEP. Because Dry Needling was performed over or adjacent to a lung field, pt was educated on S/S of pneumothorax and to seek immediate medical attention should they occur.  Patient was educated on signs and symptoms of infection and other risk factors and advised to seek medical attention should they occur.  Patient verbalized understanding of these instructions and education.   Patient Verbal Consent Given: Yes Education Handout Provided: Yes Muscles Treated: left upper trap 2x ; left sub scap 2x bilateral ECRB 2x bilateral with a .30x50 needle Electrical Stimulation Performed: No Treatment Response/Outcome: decreased spasm with expected soreness reported.   Manual: trigger point release to sub-ociptials, levator, and upper trap; review of self soft tissue mobilization to elbow and upper trap.     Treatment                            1/4:  Trigger Point Dry Needling  Initial Treatment: Pt instructed on Dry Needling rational, procedures, and possible side effects. Pt instructed to expect mild to moderate muscle soreness later in the day and/or into the next day.  Pt instructed in methods to reduce muscle soreness. Pt instructed to continue prescribed HEP. Because Dry Needling was performed over or adjacent to a lung field, pt was educated on S/S of pneumothorax and to seek immediate medical attention should they occur.  Patient was educated on signs and symptoms of infection and other risk factors and advised  to seek medical attention should they occur.  Patient verbalized understanding of these instructions and education.   Patient Verbal Consent Given: Yes Education Handout Provided: Yes Muscles Treated: bilateral upper traps, bil levator scap, Rt cervical paraspinals, bilat suboccipitals Electrical Stimulation Performed: No Treatment Response/Outcome: decreased spasm with expected  soreness reported.   Manual: trigger point release to sub-ociptials, levator, and upper trap; review of self soft tissue mobilization to elbow and upper trap.   Putty:  Key grip 2x10  Gross grip 2x10  Finger tips 2x10   Pronation/supination 2 lbs 2x15 left   DATE:  08/13/23 Pulley 2 minutes Manual: Grade II-III inferior glides in flexion/abduction, STM to deltoid and bicep Supine shoulder flexion with cane 2 x 10 Supine shoulder AAROM ER with cane 2 x 10  Seated shoulder ER 2 x 10 Standing row RTB 2 x 10 Standing shoulder extension RTB 2 x 10 Standing bilateral shoulder ER YTB 2 x 10  07/29/23 - Seated Upper Trapezius Stretch  - 1 x daily - 7 x weekly - 3 sets - 10 reps - Theracane Over Shoulder  - 1 x daily - 7 x weekly - 3 sets - 10 reps - Seated Shoulder External Rotation  - 1 x daily - 7 x weekly - 3 sets - 10 reps - Standing Bilateral Low Shoulder Row with Anchored Resistance  - 1 x daily - 7 x weekly - 3 sets - 10 reps  Reviewed self soft tissue mobilization    PATIENT EDUCATION:  Education details: Patient educated on exam findings, POC, scope of PT, HEP, . 08/13/23: HEP Person educated: Patient Education method: Programmer, multimedia, Demonstration, and Handouts Education comprehension: verbalized understanding, returned demonstration, verbal cues required, and tactile cues required   HOME EXERCISE PROGRAM: Access Code: [email or web address] URL: [email or web address] Date: 07/30/2023 Prepared by: Lorayne Bender    ASSESSMENT:   CLINICAL IMPRESSION: Pt is improving with pain and sx's as evidenced by subjective reports.  Pt is limited with L shoulder ROM and has pain with PROM.  PT performed PROM to L shoulder w/n pt tolerance.  She has tightness in L > R UT.  Pt performed exercises well with cuing and instruction in correct form.  She responded well to Rx stating she felt better after Rx.  She should continue to benefit from cont skilled PT to address impairments and goals  and to improve overall function.   OBJECTIVE IMPAIRMENTS: decreased activity tolerance, decreased ROM, decreased strength, impaired flexibility, improper body mechanics, and pain.    ACTIVITY LIMITATIONS: carrying, lifting, bending, bathing, dressing, reach over head, hygiene/grooming, and caring for others   PARTICIPATION LIMITATIONS: meal prep, cleaning, laundry, shopping, community activity, occupation, and yard work   PERSONAL FACTORS: Time since onset of injury/illness/exacerbation and 1-2 comorbidities: HTN, HLD  are also affecting patient's functional outcome.    REHAB POTENTIAL: Good   CLINICAL DECISION MAKING: Stable/uncomplicated   EVALUATION COMPLEXITY: Low     GOALS: Goals reviewed with patient? Yes   SHORT TERM GOALS: Target date: 08/27/2023     Patient will be independent with HEP in order to improve functional outcomes. Baseline: Goal status: progressing 1/17   2.  Patient will report at least 25% improvement in symptoms for improved quality of life. Baseline:  Goal status: achived 1/17   3.  Patient will improve passive ER by 20 degrees  Baseline:  Goal status: achieved 1/17   4.  Patient will increase left shoulder active flexion to 140 degrees  Baseline:  Goal status:  INITIAL   LONG TERM GOALS: Target date: 09/24/2023     Patient will report at least 75% improvement in symptoms for improved quality of life. Baseline:  Goal status: INITIAL   2.  Patient will improve FOTO score by at least 19 points in order to indicate improved tolerance to activity. Baseline:  Goal status: INITIAL   3.  Patient will demonstrate at least 150 in shoulder AROM in flexion for improved ability lift overhead. Baseline:  Goal status: INITIAL   4.  Patient will be able to return to all activities unrestricted for improved ability to perform work functions and participate with family.  Baseline:  Goal status: INITIAL   5.  Patient will demonstrate grade of 5/5 MMT  grade in all tested musculature as evidence of improved strength to assist with lifting at home. Baseline:  Goal status: INITIAL      PLAN:   PT FREQUENCY: 1-2x/week   PT DURATION: 8 weeks   PLANNED INTERVENTIONS: [id number]- PT Re-evaluation, 97110-Therapeutic exercises, 97530- Therapeutic activity, 97112- Neuromuscular re-education, 97535- Self Care, 84696- Manual therapy, [phone]- Gait training, [phone]- Orthotic Fit/training, [phone]- Canalith repositioning, [id number]- Aquatic Therapy, [phone]- Splinting, Patient/Family education, Balance training, Stair training, Taping, Dry Needling, Joint mobilization, Joint manipulation, Spinal manipulation, Spinal mobilization, Scar mobilization, and DME instructions.     PLAN FOR NEXT SESSION: consider dry needling f upper trap and sub-scap; continue with TP release to all painful area; consider wand press and pulley next visit; add shoulder extension if tolerated.   Audie Clear III PT, DPT 09/07/23 8:27 PM

## 2023-09-06 ENCOUNTER — Ambulatory Visit (HOSPITAL_BASED_OUTPATIENT_CLINIC_OR_DEPARTMENT_OTHER): Payer: 59 | Admitting: Physical Therapy

## 2023-09-06 DIAGNOSIS — M25512 Pain in left shoulder: Secondary | ICD-10-CM | POA: Diagnosis not present

## 2023-09-06 DIAGNOSIS — G8929 Other chronic pain: Secondary | ICD-10-CM

## 2023-09-06 DIAGNOSIS — M62838 Other muscle spasm: Secondary | ICD-10-CM

## 2023-09-06 DIAGNOSIS — M25612 Stiffness of left shoulder, not elsewhere classified: Secondary | ICD-10-CM

## 2023-09-07 ENCOUNTER — Encounter (HOSPITAL_BASED_OUTPATIENT_CLINIC_OR_DEPARTMENT_OTHER): Payer: Self-pay | Admitting: Physical Therapy

## 2023-09-09 ENCOUNTER — Encounter (HOSPITAL_BASED_OUTPATIENT_CLINIC_OR_DEPARTMENT_OTHER): Payer: BC Managed Care – PPO | Admitting: Physical Therapy

## 2023-09-23 ENCOUNTER — Ambulatory Visit (HOSPITAL_BASED_OUTPATIENT_CLINIC_OR_DEPARTMENT_OTHER): Payer: 59 | Attending: Family Medicine | Admitting: Physical Therapy

## 2023-09-23 DIAGNOSIS — G8929 Other chronic pain: Secondary | ICD-10-CM | POA: Diagnosis present

## 2023-09-23 DIAGNOSIS — M62838 Other muscle spasm: Secondary | ICD-10-CM | POA: Diagnosis present

## 2023-09-23 DIAGNOSIS — M25512 Pain in left shoulder: Secondary | ICD-10-CM | POA: Insufficient documentation

## 2023-09-23 DIAGNOSIS — M25612 Stiffness of left shoulder, not elsewhere classified: Secondary | ICD-10-CM | POA: Insufficient documentation

## 2023-09-23 NOTE — Therapy (Addendum)
 "  OUTPATIENT PHYSICAL THERAPY TREATMENT / PROGRESS NOTE   Patient Name: Bethany Mata MRN: [id number] DOB:May 20, 1971, 53 y.o., female Today's Date: 09/24/2023  END OF SESSION:  PT End of Session - 09/23/23 0933     Visit Number 7    Number of Visits 13    Date for PT Re-Evaluation 11/04/23    Authorization Type BCBS    PT Start Time 0851    PT Stop Time 0933    PT Time Calculation (min) 42 min    Activity Tolerance Patient tolerated treatment well    Behavior During Therapy [facility] for tasks assessed/performed                 Past Medical History:  Diagnosis Date   Elevated cholesterol    Hypertension    Seasonal allergies    Past Surgical History:  Procedure Laterality Date   AUGMENTATION MAMMAPLASTY Bilateral 06/2016   saline implants    BREAST SURGERY     ENDOMETRIAL ABLATION  3/13   Cryoablation   GYNECOLOGIC CRYOSURGERY     REFRACTIVE SURGERY  5-12   BILATERAL   Patient Active Problem List   Diagnosis Date Noted   Cough 07/27/2014   Hypothyroidism 10/26/2012   Essential hypertension, benign 10/26/2012      PCP: None recorded    REFERRING PROVIDER: Teressa Rainell BROCKS, DO   REFERRING DIAG: [phone] (ICD-10-CM) - Pain in joint of left shoulder M25.522 (ICD-10-CM) - Left elbow pain   THERAPY DIAG:  Chronic left shoulder pain Stiffness of left shoulder Other muscle spasm   Rationale for Evaluation and Treatment: Rehabilitation   ONSET DATE: Summer of 2024    SUBJECTIVE:                                                                                                                                                                                       SUBJECTIVE STATEMENT: Pt states it has been a lot better, though not completely gone.  Pt is pleased with progress.  Pt states the DN helped.  Pt denies any adverse effects after prior Rx. FUNCTIONAL LIMITATIONS:  pain with donning sweatshirt, reaching behind the back, and reaching out to close the door  though has improved.  sleeping. FUNCTIONAL IMPROVEMENTS:  ROM and pain.  Pt reports 80% improvement in sx's overall.  Pt is able to sleep on her L side though still is uncomfortable.  Sleeping.   Hand dominance: Right   PERTINENT HISTORY: HTN, HLD   PAIN:  Are you having pain? Yes: NPRS scale: 3/10  Pain location: L shoulder Pain description: aching  Aggravating factors: use of the arm  Relieving  factors: heat sometimes; voltarin    PRECAUTIONS: None   RED FLAGS: None       WEIGHT BEARING RESTRICTIONS: No   FALLS:  Has patient fallen in last 6 months? 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, Broadcasting: gardening; reading     PLOF: Independent   PATIENT GOALS: To have less pain    OBJECTIVE: (objective measures from initial evaluation unless otherwise dated)       UPPER EXTREMITY ROM:    Active ROM Right eval Left eval  Shoulder flexion   108    Shoulder extension      Shoulder abduction      Shoulder adduction      Shoulder internal rotation   To buttock with pain    Shoulder external rotation    painful to reach her head   Elbow flexion      Elbow extension      Wrist flexion      Wrist extension      Wrist ulnar deviation      Wrist radial deviation      Wrist pronation      Wrist supination      (Blank rows = not tested) *=pain/symptoms  Active ROM Right eval Left eval Left 08/13/23 Right 2/6 Left 2/6  Shoulder flexion   132  AAROM 151 174 137 with pain  Shoulder scaption       154 with pain  Shoulder abduction         Shoulder adduction         Shoulder internal rotation   67    74  Shoulder external rotation    45    47/48  AROM/PROM  Elbow flexion         Elbow extension         Wrist flexion         Wrist extension         Wrist ulnar deviation         Wrist radial deviation         Wrist pronation         Wrist supination         (Blank rows = not tested) *=pain/symptoms   UPPER EXTREMITY MMT:   MMT  Right 2/6 Left 2/6  Shoulder flexion  5/5 4/5  Shoulder scaption  5/5  3+/5 with pain   Shoulder abduction      Shoulder adduction      Shoulder internal rotation    4+/5  Shoulder external rotation   4/5   Middle trapezius      Lower trapezius      Elbow flexion      Elbow extension      Wrist flexion      Wrist extension      Wrist ulnar deviation      Wrist radial deviation      Wrist pronation      Wrist supination      Grip strength (lbs)      (Blank rows = not tested) *=pain/symptoms Not tested 2nd to pain        JOINT MOBILITY TESTING:     PALPATION:  Tender to palpation in upper trap, posterior shoulder, and bicpes tendon               TODAY'S TREATMENT:  09/23/23  Reviewed current function, pain level, and response to prior Rx. Assessed shoulder ROM and strength.  See above  FOTO:  Initial/Current:  47 / 54.  Goal of 66.  Supine wand flexion x10 reps Supine wand ER 2x10 Shoulder wall walks in flexion x 10 reps   09/06/23  Manual: Grade II-III post and inferior glides in abduction f/b L shoulder flexion, scaption, ER, and IR PROM in supine per pt tolerance. STM to bilat UT  Supine wand flexion x10 reps Supine wand ER 2x10 Supine serratus punch 2x10 Standing rows with retraction x10 with RTB and x 10 with GTB    1/17 Trigger Point Dry Needling  Initial Treatment: Pt instructed on Dry Needling rational, procedures, and possible side effects. Pt instructed to expect mild to moderate muscle soreness later in the day and/or into the next day.  Pt instructed in methods to reduce muscle soreness. Pt instructed to continue prescribed HEP. Because Dry Needling was performed over or adjacent to a lung field, pt was educated on S/S of pneumothorax and to seek immediate medical attention should they occur.  Patient was educated  on signs and symptoms of infection and other risk factors and advised to seek medical attention should they occur.  Patient verbalized understanding of these instructions and education.   Patient Verbal Consent Given: Yes Education Handout Provided: Yes Muscles Treated: left upper trap 2x ; left sub scap 2x bilateral ECRB 2x bilateral with a .30x50 needle Electrical Stimulation Performed: No Treatment Response/Outcome: decreased spasm with expected soreness reported.   Manual: trigger point release to sub-ociptials, levator, and upper trap; review of self soft tissue mobilization to elbow and upper trap.     Treatment                            1/4:  Trigger Point Dry Needling  Initial Treatment: Pt instructed on Dry Needling rational, procedures, and possible side effects. Pt instructed to expect mild to moderate muscle soreness later in the day and/or into the next day.  Pt instructed in methods to reduce muscle soreness. Pt instructed to continue prescribed HEP. Because Dry Needling was performed over or adjacent to a lung field, pt was educated on S/S of pneumothorax and to seek immediate medical attention should they occur.  Patient was educated on signs and symptoms of infection and other risk factors and advised to seek medical attention should they occur.  Patient verbalized understanding of these instructions and education.   Patient Verbal Consent Given: Yes Education Handout Provided: Yes Muscles Treated: bilateral upper traps, bil levator scap, Rt cervical paraspinals, bilat suboccipitals Electrical Stimulation Performed: No Treatment Response/Outcome: decreased spasm with expected soreness reported.   Manual: trigger point release to sub-ociptials, levator, and upper trap; review of self soft tissue mobilization to elbow and upper trap.   Putty:  Key grip 2x10  Gross grip 2x10  Finger tips 2x10   Pronation/supination 2 lbs 2x15 left   DATE:  08/13/23 Pulley 2  minutes Manual: Grade II-III inferior glides in flexion/abduction, STM to deltoid and bicep Supine shoulder flexion with cane 2 x 10 Supine shoulder AAROM ER with cane 2 x 10  Seated shoulder ER 2 x 10 Standing row RTB 2 x 10 Standing shoulder extension RTB 2 x 10 Standing bilateral shoulder ER YTB 2 x 10  07/29/23 - Seated Upper Trapezius Stretch  - 1 x daily - 7 x weekly - 3 sets -  10 reps - Theracane Over Shoulder  - 1 x daily - 7 x weekly - 3 sets - 10 reps - Seated Shoulder External Rotation  - 1 x daily - 7 x weekly - 3 sets - 10 reps - Standing Bilateral Low Shoulder Row with Anchored Resistance  - 1 x daily - 7 x weekly - 3 sets - 10 reps  Reviewed self soft tissue mobilization    PATIENT EDUCATION:  Education details: Patient educated on exam findings, POC, scope of PT, HEP, . 08/13/23: HEP Person educated: Patient Education method: Programmer, Multimedia, Demonstration, and Handouts Education comprehension: verbalized understanding, returned demonstration, verbal cues required, and tactile cues required   HOME EXERCISE PROGRAM: Access Code: [email or web address] URL: [email or web address] Date: 07/30/2023 Prepared by: Alm Don    ASSESSMENT:   CLINICAL IMPRESSION: Pt is improving with pain, function, and sx's as evidenced by subjective reports.  Pt reports 80% improvement in sx's overall.  Pt has continued pain with donning sweatshirt, reaching behind the back, and reaching out to close the door though has improved in all those activities.  Pt has improved with sleeping though still has disturbed sleep.  Pt has limitations in L shoulder ROM, especially ER, and has strength deficits t/o L shoulder.  Pt has not met FOTO goal though is improving with FOTO score.  Pt met LTG #1 and is progressing toward other goals.  She did not meet her strength goal though should improved with strength with continued PT.  Pt performed exercises well with cuing and instruction in correct form.  She  responded well to Rx having no c/o's after Rx.  She should continue to benefit from cont skilled PT to address goals and to improve ROM, strength, and assist in restoring desired level of function.    OBJECTIVE IMPAIRMENTS: decreased activity tolerance, decreased ROM, decreased strength, impaired flexibility, improper body mechanics, and pain.    ACTIVITY LIMITATIONS: carrying, lifting, bending, bathing, dressing, reach over head, hygiene/grooming, and caring for others   PARTICIPATION LIMITATIONS: meal prep, cleaning, laundry, shopping, community activity, occupation, and yard work   PERSONAL FACTORS: Time since onset of injury/illness/exacerbation and 1-2 comorbidities: HTN, HLD  are also affecting patient's functional outcome.    REHAB POTENTIAL: Good   CLINICAL DECISION MAKING: Stable/uncomplicated   EVALUATION COMPLEXITY: Low     GOALS: Goals reviewed with patient? Yes   SHORT TERM GOALS: Target date: 08/27/2023     Patient will be independent with HEP in order to improve functional outcomes. Baseline: Goal status: progressing 1/17   2.  Patient will report at least 25% improvement in symptoms for improved quality of life. Baseline:  Goal status: achieved 1/17   3.  Patient will improve passive ER by 20 degrees  Baseline:  Goal status: achieved 1/17   4.  Patient will increase left shoulder active flexion to 140 degrees  Baseline:  Goal status: progressing  09/23/23   LONG TERM GOALS: Target date: 11/04/2023     Patient will report at least 75% improvement in symptoms for improved quality of life. Baseline:  Goal status:  GOAL MET  09/23/23   2.  Patient will improve FOTO score by at least 19 points in order to indicate improved tolerance to activity. Baseline:  Goal status: progressing  09/23/23   3.  Patient will demonstrate at least 150 in shoulder AROM in flexion for improved ability lift overhead. Baseline:  Goal status: ongoing  09/23/23   4.  Patient will be  able to return to  all activities unrestricted for improved ability to perform work functions and participate with family.  Baseline:  Goal status: ongoing  09/23/23   5.  Patient will demonstrate grade of 5/5 MMT grade in all tested musculature as evidence of improved strength to assist with lifting at home. Baseline:  Goal status: not met  09/23/23      PLAN:   PT FREQUENCY: 1-2x/week   PT DURATION: 6 weeks   PLANNED INTERVENTIONS: 97164- PT Re-evaluation, 97110-Therapeutic exercises, 97530- Therapeutic activity, 97112- Neuromuscular re-education, 97535- Self Care, 02859- Manual therapy, [phone]- Gait training, [phone]- Orthotic Fit/training, [phone]- Canalith repositioning, [id number]- Aquatic Therapy, [phone]- Splinting, Patient/Family education, Balance training, Stair training, Taping, Dry Needling, Joint mobilization, Joint manipulation, Spinal manipulation, Spinal mobilization, Scar mobilization, and DME instructions.     PLAN FOR NEXT SESSION: consider dry needling f upper trap and sub-scap; continue with TP release to all painful area; consider wand press and pulley next visit;  Work on shoulder ROM.  Leigh Minerva III PT, DPT 09/24/23 7:28 AM  PHYSICAL THERAPY DISCHARGE SUMMARY  Visits from Start of Care: 7  Current functional level related to goals / functional outcomes: See above   Remaining deficits: See above   Education / Equipment: See above   Patient was seen in PT from 07/29/23 to 09/23/23.  A progress note was completed on 09/23/23, her last visit.  See above for detailed measurements and assessment.  Pt did not schedule any further PT after 09/23/23 appt and will be considered discharged at this time.    Leigh Minerva III PT, DPT 09/12/24 10:57 AM        "

## 2023-09-24 ENCOUNTER — Encounter (HOSPITAL_BASED_OUTPATIENT_CLINIC_OR_DEPARTMENT_OTHER): Payer: Self-pay | Admitting: Physical Therapy

## 2023-11-15 ENCOUNTER — Ambulatory Visit: Payer: BC Managed Care – PPO | Admitting: Nurse Practitioner

## 2023-12-25 LAB — COLOGUARD: COLOGUARD: NEGATIVE

## 2024-01-26 ENCOUNTER — Other Ambulatory Visit: Payer: Self-pay | Admitting: Nurse Practitioner

## 2024-01-26 DIAGNOSIS — Z79890 Hormone replacement therapy (postmenopausal): Secondary | ICD-10-CM

## 2024-01-26 NOTE — Telephone Encounter (Signed)
 Med refill request: estradiol  0.0375 mg patch Last OV: 12/16/22 Colposcopy Last AEX: 09/22/22  Next AEX: none scheduled Last MMG (if hormonal med) 10/06/21 normal Refill denied.  Needs appointment.  Message sent to front desk. Sent to provider for review.

## 2024-02-01 ENCOUNTER — Other Ambulatory Visit: Payer: Self-pay | Admitting: Nurse Practitioner

## 2024-02-01 DIAGNOSIS — Z79890 Hormone replacement therapy (postmenopausal): Secondary | ICD-10-CM

## 2024-02-01 NOTE — Telephone Encounter (Signed)
 Med refill request: progesterone  100 mg Last AEX: 09/22/22 Next AEX: 04/06/24 Last MMG (if hormonal med) 01/28/24 [facility] Refill authorized: progesterone  100 mg Please approve or deny as appropriate.

## 2024-04-03 ENCOUNTER — Other Ambulatory Visit: Payer: Self-pay | Admitting: Nurse Practitioner

## 2024-04-03 DIAGNOSIS — Z79890 Hormone replacement therapy (postmenopausal): Secondary | ICD-10-CM

## 2024-04-04 NOTE — Telephone Encounter (Signed)
.  Med refill request: Vivelle -dot Last AEX: 09/22/22 Next AEX: 04/06/24 Last MMG (if hormonal med) 11/16/22 Refill authorized: Please Advise?

## 2024-04-05 NOTE — Progress Notes (Unsigned)
 Bethany Mata 10-03-1970 [id number]   History:  53 y.o. [id number] presents for annual exam. Postmenopausal - on HRT with good relief of hot flashes. Has been extending use due to running low of patches and she experiences symptoms at end of each week. Cryo greater than 10 years ago, 2023 ASCUS + HR HPV, 2024 ASCUS neg HPV, 12/2022 CIN-1.  Normal mammogram history. HTN, hypothyroidism managed by PCP.   Gynecologic History Patient's last menstrual period was 10/31/2013.   Contraception/Family planning: post menopausal status Sexually active: Yes  Health Maintenance Last Pap: 09/22/2022. Results were: ASCUS neg HPV Last mammogram:  02/01/2024. Results were: Normal Last colonoscopy: Never. Cologuard 12/2023 negative Last Dexa: Not indicated     04/06/2024    8:21 AM  Depression screen PHQ 2/9  Decreased Interest 0  Down, Depressed, Hopeless 0  PHQ - 2 Score 0     Past medical history, past surgical history, family history and social history were all reviewed and documented in the EPIC chart. Married. Working for [employer]or, [employer]. Husband on disability d/t stroke in 2021. Daughter lives at home. Also has son and stepdaughter. Sister diagnosed with sarcoma.   ROS:  A ROS was performed and pertinent positives and negatives are included.  Exam:  Vitals:   04/06/24 0745  BP: 124/82  Pulse: 92  SpO2: 99%  Weight: 170 lb (77.1 kg)  Height: 5' 4 (1.626 m)    Body mass index is 29.18 kg/m.  General appearance:  Normal Thyroid :  Symmetrical, normal in size, without palpable masses or nodularity. Respiratory  Auscultation:  Clear without wheezing or rhonchi Cardiovascular  Auscultation:  Regular rate, without rubs, murmurs or gallops  Edema/varicosities:  Not grossly evident Abdominal  Soft,nontender, without masses, guarding or rebound.  Liver/spleen:  No organomegaly noted  Hernia:  None appreciated  Skin  Inspection:  Grossly normal   Breasts: Examined lying and sitting. Bilateral  implants noted  Right: Without masses, retractions, discharge or axillary adenopathy.   Left: Without masses, retractions, discharge or axillary adenopathy. Pelvic: External genitalia:  no lesions              Urethra:  normal appearing urethra with no masses, tenderness or lesions              Bartholins and Skenes: normal                 Vagina: normal appearing vagina with normal color and discharge, no lesions              Cervix: no lesions Bimanual Exam:  Uterus:  no masses or tenderness              Adnexa: no mass, fullness, tenderness              Rectovaginal: Deferred              Anus:  normal, no lesions  Dereck Keas, CMA present as chaperone.   Assessment/Plan:  53 y.o. [id number] for annual exam.   Well female exam with routine gynecological exam - Education provided on SBEs, importance of preventative screenings, current guidelines, high calcium diet, regular exercise, and multivitamin daily. Labs with PCP.   Postmenopausal hormone therapy - Plan: estradiol  (VIVELLE -DOT) 0.0375 MG/24HR twice weekly, progesterone  (PROMETRIUM ) 100 MG capsule nightly.  Good relief of hot flashes. We discussed the benefits and risks of HRT. She would like to continue. Refill x 1 year provided.   Cervical cancer screening - Plan: Cytology - PAP(  [facility]). Cryo greater than 10 years ago, 2020 ASCUS neg HPV, 09/2021 ASCUS + HR HPV, 2024 ASCUS neg HPV, CIN-1 12/2022. Pap today per guidelines.   Screening for breast cancer - Normal mammogram history.  Continue annual screenings.  Normal breast exam today.  Screening for colon cancer - Negative Cologuard I 12/2023. Will repeat at 3-year interval per recommendations.   Return in about 1 year (around 04/06/2025) for Annual.        Annabella DELENA Shutter [facility], 8:32 AM 04/06/2024

## 2024-04-06 ENCOUNTER — Encounter: Payer: Self-pay | Admitting: Nurse Practitioner

## 2024-04-06 ENCOUNTER — Ambulatory Visit (INDEPENDENT_AMBULATORY_CARE_PROVIDER_SITE_OTHER): Payer: Self-pay | Admitting: Nurse Practitioner

## 2024-04-06 ENCOUNTER — Other Ambulatory Visit (HOSPITAL_COMMUNITY)
Admission: RE | Admit: 2024-04-06 | Discharge: 2024-04-06 | Disposition: A | Discharge status: Home / Self Care | Source: Ambulatory Visit | Attending: Nurse Practitioner | Admitting: Nurse Practitioner

## 2024-04-06 VITALS — BP 124/82 | HR 92 | Ht 64.0 in | Wt 170.0 lb

## 2024-04-06 DIAGNOSIS — Z1331 Encounter for screening for depression: Secondary | ICD-10-CM

## 2024-04-06 DIAGNOSIS — Z01419 Encounter for gynecological examination (general) (routine) without abnormal findings: Secondary | ICD-10-CM

## 2024-04-06 DIAGNOSIS — Z124 Encounter for screening for malignant neoplasm of cervix: Secondary | ICD-10-CM

## 2024-04-06 DIAGNOSIS — Z79890 Hormone replacement therapy (postmenopausal): Secondary | ICD-10-CM

## 2024-04-06 MED ORDER — ESTRADIOL 0.0375 MG/24HR TD PTTW: 1.0000 | MEDICATED_PATCH | TRANSDERMAL | 3 refills | Status: AC

## 2024-04-06 MED ORDER — PROGESTERONE MICRONIZED 100 MG PO CAPS: 100.0000 mg | ORAL_CAPSULE | Freq: Every evening | ORAL | 3 refills | Status: AC

## 2024-04-11 ENCOUNTER — Other Ambulatory Visit: Payer: Self-pay | Admitting: Nurse Practitioner

## 2024-04-11 ENCOUNTER — Ambulatory Visit: Payer: Self-pay | Admitting: Nurse Practitioner

## 2024-04-11 DIAGNOSIS — B9689 Other specified bacterial agents as the cause of diseases classified elsewhere: Secondary | ICD-10-CM

## 2024-04-11 LAB — CYTOLOGY - PAP
Comment: NEGATIVE
Diagnosis: NEGATIVE
Diagnosis: REACTIVE
High risk HPV: NEGATIVE

## 2024-04-11 MED ORDER — METRONIDAZOLE 500 MG PO TABS: 500.0000 mg | ORAL_TABLET | Freq: Two times a day (BID) | ORAL | 0 refills | Status: AC

## 2024-09-13 ENCOUNTER — Encounter: Payer: Self-pay | Admitting: Nurse Practitioner
